# Patient Record
Sex: Female | Born: 1970 | Race: White | Hispanic: No | Marital: Married | Smoking: Never smoker
Health system: Southern US, Community
[De-identification: ages and names within clinical notes are randomized; demographics above are authoritative.]

## PROBLEM LIST (undated history)

## (undated) DIAGNOSIS — R51 Headache: Secondary | ICD-10-CM

## (undated) DIAGNOSIS — E063 Autoimmune thyroiditis: Secondary | ICD-10-CM

## (undated) DIAGNOSIS — R42 Dizziness and giddiness: Secondary | ICD-10-CM

## (undated) DIAGNOSIS — Z87442 Personal history of urinary calculi: Secondary | ICD-10-CM

## (undated) DIAGNOSIS — R5382 Chronic fatigue, unspecified: Secondary | ICD-10-CM

## (undated) DIAGNOSIS — R519 Headache, unspecified: Secondary | ICD-10-CM

## (undated) DIAGNOSIS — E785 Hyperlipidemia, unspecified: Secondary | ICD-10-CM

## (undated) HISTORY — DX: Headache, unspecified: R51.9

## (undated) HISTORY — DX: Autoimmune thyroiditis: E06.3

## (undated) HISTORY — DX: Personal history of urinary calculi: Z87.442

## (undated) HISTORY — DX: Dizziness and giddiness: R42

## (undated) HISTORY — DX: Chronic fatigue, unspecified: R53.82

## (undated) HISTORY — DX: Headache: R51

## (undated) HISTORY — PX: DILATION AND CURETTAGE OF UTERUS: SHX78

## (undated) HISTORY — DX: Hyperlipidemia, unspecified: E78.5

## (undated) HISTORY — PX: RHINOPLASTY: SUR1284

## (undated) HISTORY — PX: LITHOTRIPSY: SUR834

---
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
## 1999-10-25 ENCOUNTER — Other Ambulatory Visit: Admission: RE | Admit: 1999-10-25 | Discharge: 1999-10-25 | Payer: Self-pay | Admitting: Internal Medicine

## 1999-11-19 ENCOUNTER — Encounter (INDEPENDENT_AMBULATORY_CARE_PROVIDER_SITE_OTHER): Payer: Self-pay | Admitting: Specialist

## 1999-11-19 ENCOUNTER — Other Ambulatory Visit: Admission: RE | Admit: 1999-11-19 | Discharge: 1999-11-19 | Payer: Self-pay | Admitting: *Deleted

## 2000-04-05 ENCOUNTER — Other Ambulatory Visit: Admission: RE | Admit: 2000-04-05 | Discharge: 2000-04-05 | Payer: Self-pay | Admitting: *Deleted

## 2000-07-04 ENCOUNTER — Other Ambulatory Visit: Admission: RE | Admit: 2000-07-04 | Discharge: 2000-07-04 | Payer: Self-pay | Admitting: *Deleted

## 2001-01-10 ENCOUNTER — Other Ambulatory Visit: Admission: RE | Admit: 2001-01-10 | Discharge: 2001-01-10 | Payer: Self-pay | Admitting: *Deleted

## 2001-02-01 ENCOUNTER — Other Ambulatory Visit: Admission: RE | Admit: 2001-02-01 | Discharge: 2001-02-01 | Payer: Self-pay | Admitting: *Deleted

## 2001-02-01 ENCOUNTER — Encounter (INDEPENDENT_AMBULATORY_CARE_PROVIDER_SITE_OTHER): Payer: Self-pay

## 2001-04-02 ENCOUNTER — Other Ambulatory Visit: Admission: RE | Admit: 2001-04-02 | Discharge: 2001-04-02 | Payer: Self-pay | Admitting: Internal Medicine

## 2001-08-20 ENCOUNTER — Other Ambulatory Visit: Admission: RE | Admit: 2001-08-20 | Discharge: 2001-08-20 | Payer: Self-pay | Admitting: Obstetrics and Gynecology

## 2002-02-18 ENCOUNTER — Other Ambulatory Visit: Admission: RE | Admit: 2002-02-18 | Discharge: 2002-02-18 | Payer: Self-pay | Admitting: Obstetrics and Gynecology

## 2002-08-23 ENCOUNTER — Other Ambulatory Visit: Admission: RE | Admit: 2002-08-23 | Discharge: 2002-08-23 | Payer: Self-pay | Admitting: Obstetrics and Gynecology

## 2003-03-13 ENCOUNTER — Other Ambulatory Visit: Admission: RE | Admit: 2003-03-13 | Discharge: 2003-03-13 | Payer: Self-pay | Admitting: Internal Medicine

## 2004-06-04 ENCOUNTER — Other Ambulatory Visit: Admission: RE | Admit: 2004-06-04 | Discharge: 2004-06-04 | Payer: Self-pay | Admitting: Internal Medicine

## 2005-04-25 ENCOUNTER — Ambulatory Visit: Payer: Self-pay | Admitting: Internal Medicine

## 2005-05-06 ENCOUNTER — Ambulatory Visit: Payer: Self-pay | Admitting: Internal Medicine

## 2005-07-04 ENCOUNTER — Other Ambulatory Visit: Admission: RE | Admit: 2005-07-04 | Discharge: 2005-07-04 | Payer: Self-pay | Admitting: Internal Medicine

## 2005-07-04 ENCOUNTER — Ambulatory Visit: Payer: Self-pay | Admitting: Internal Medicine

## 2006-05-19 ENCOUNTER — Ambulatory Visit: Payer: Self-pay | Admitting: Internal Medicine

## 2006-06-01 ENCOUNTER — Encounter: Payer: Self-pay | Admitting: Internal Medicine

## 2006-06-01 ENCOUNTER — Ambulatory Visit: Payer: Self-pay | Admitting: Internal Medicine

## 2006-06-01 ENCOUNTER — Other Ambulatory Visit: Admission: RE | Admit: 2006-06-01 | Discharge: 2006-06-01 | Payer: Self-pay | Admitting: Internal Medicine

## 2008-09-08 ENCOUNTER — Ambulatory Visit: Payer: Self-pay | Admitting: Internal Medicine

## 2008-09-08 LAB — CONVERTED CEMR LAB
Albumin: 4.1 g/dL (ref 3.5–5.2)
Alkaline Phosphatase: 34 units/L — ABNORMAL LOW (ref 39–117)
BUN: 17 mg/dL (ref 6–23)
Basophils Absolute: 0.1 10*3/uL (ref 0.0–0.1)
Bilirubin Urine: NEGATIVE
Blood in Urine, dipstick: NEGATIVE
Cholesterol: 268 mg/dL (ref 0–200)
Direct LDL: 182.5 mg/dL
Eosinophils Absolute: 0.3 10*3/uL (ref 0.0–0.7)
Eosinophils Relative: 2.8 % (ref 0.0–5.0)
GFR calc Af Amer: 103 mL/min
GFR calc non Af Amer: 85 mL/min
Glucose, Urine, Semiquant: NEGATIVE
HCT: 39.5 % (ref 36.0–46.0)
HDL: 46.2 mg/dL (ref >39.0)
Ketones, urine, test strip: NEGATIVE
MCHC: 34.2 g/dL (ref 30.0–36.0)
MCV: 92.8 fL (ref 78.0–100.0)
Monocytes Absolute: 0.4 10*3/uL (ref 0.1–1.0)
Neutrophils Relative %: 58.3 % (ref 43.0–77.0)
Platelets: 375 10*3/uL (ref 150–400)
Potassium: 4.3 meq/L (ref 3.5–5.1)
RDW: 14.9 % — ABNORMAL HIGH (ref 11.5–14.6)
Specific Gravity, Urine: 1.025
Total Bilirubin: 0.8 mg/dL (ref 0.3–1.2)
Triglycerides: 151 mg/dL — ABNORMAL HIGH (ref 0–149)
WBC: 9.6 10*3/uL (ref 4.5–10.5)
pH: 5.5

## 2008-09-16 ENCOUNTER — Ambulatory Visit: Payer: Self-pay | Admitting: Internal Medicine

## 2008-09-16 DIAGNOSIS — E039 Hypothyroidism, unspecified: Secondary | ICD-10-CM

## 2008-09-16 DIAGNOSIS — J309 Allergic rhinitis, unspecified: Secondary | ICD-10-CM | POA: Insufficient documentation

## 2008-09-16 DIAGNOSIS — E038 Other specified hypothyroidism: Secondary | ICD-10-CM | POA: Insufficient documentation

## 2008-09-29 ENCOUNTER — Telehealth: Payer: Self-pay | Admitting: *Deleted

## 2008-10-31 ENCOUNTER — Ambulatory Visit: Payer: Self-pay | Admitting: Internal Medicine

## 2008-10-31 LAB — CONVERTED CEMR LAB
Cholesterol: 197 mg/dL (ref 0–200)
HDL: 43.8 mg/dL (ref >39.0)
LDL Cholesterol: 114 mg/dL — ABNORMAL HIGH (ref 0–99)
TSH: 0.17 microintl units/mL — ABNORMAL LOW (ref 0.35–5.50)
Triglycerides: 196 mg/dL — ABNORMAL HIGH (ref 0–149)

## 2008-11-11 ENCOUNTER — Ambulatory Visit: Payer: Self-pay | Admitting: Internal Medicine

## 2008-12-10 DIAGNOSIS — R635 Abnormal weight gain: Secondary | ICD-10-CM | POA: Insufficient documentation

## 2009-01-14 ENCOUNTER — Ambulatory Visit: Payer: Self-pay | Admitting: Internal Medicine

## 2009-01-14 LAB — CONVERTED CEMR LAB
Free T4: 1.8 ng/dL — ABNORMAL HIGH (ref 0.6–1.6)
TSH: 0.02 microintl units/mL — ABNORMAL LOW (ref 0.35–5.50)

## 2009-01-19 ENCOUNTER — Ambulatory Visit: Payer: Self-pay | Admitting: Internal Medicine

## 2009-01-19 DIAGNOSIS — L259 Unspecified contact dermatitis, unspecified cause: Secondary | ICD-10-CM | POA: Insufficient documentation

## 2009-03-04 ENCOUNTER — Telehealth: Payer: Self-pay | Admitting: Internal Medicine

## 2009-04-13 ENCOUNTER — Ambulatory Visit: Payer: Self-pay | Admitting: Internal Medicine

## 2009-04-13 LAB — CONVERTED CEMR LAB: TSH: 0.01 microintl units/mL — ABNORMAL LOW (ref 0.35–5.50)

## 2009-04-21 ENCOUNTER — Telehealth: Payer: Self-pay | Admitting: Internal Medicine

## 2009-10-27 ENCOUNTER — Ambulatory Visit: Payer: Self-pay | Admitting: Internal Medicine

## 2009-10-27 LAB — CONVERTED CEMR LAB
AST: 20 units/L (ref 0–37)
Albumin: 3.8 g/dL (ref 3.5–5.2)
Basophils Absolute: 0 10*3/uL (ref 0.0–0.1)
CO2: 29 meq/L (ref 19–32)
Chloride: 104 meq/L (ref 96–112)
GFR calc non Af Amer: 145.24 mL/min (ref >60)
Glucose, Bld: 81 mg/dL (ref 70–99)
HCT: 40.9 % (ref 36.0–46.0)
Hemoglobin: 13.9 g/dL (ref 12.0–15.0)
Lymphs Abs: 3.3 10*3/uL (ref 0.7–4.0)
MCHC: 34 g/dL (ref 30.0–36.0)
Monocytes Relative: 4 % (ref 3.0–12.0)
Neutro Abs: 5.4 10*3/uL (ref 1.4–7.7)
Nitrite: NEGATIVE
Potassium: 4.6 meq/L (ref 3.5–5.1)
RDW: 12.6 % (ref 11.5–14.6)
Sodium: 140 meq/L (ref 135–145)
Specific Gravity, Urine: 1.02
TSH: 0.05 microintl units/mL — ABNORMAL LOW (ref 0.35–5.50)
WBC Urine, dipstick: NEGATIVE

## 2009-11-02 ENCOUNTER — Telehealth: Payer: Self-pay | Admitting: Internal Medicine

## 2009-11-09 ENCOUNTER — Other Ambulatory Visit: Admission: RE | Admit: 2009-11-09 | Discharge: 2009-11-09 | Payer: Self-pay | Admitting: Internal Medicine

## 2009-11-09 ENCOUNTER — Ambulatory Visit: Payer: Self-pay | Admitting: Internal Medicine

## 2009-11-12 ENCOUNTER — Telehealth: Payer: Self-pay | Admitting: Internal Medicine

## 2010-11-15 ENCOUNTER — Ambulatory Visit: Payer: Self-pay | Admitting: Internal Medicine

## 2010-11-15 LAB — CONVERTED CEMR LAB
ALT: 16 units/L (ref 0–35)
Albumin: 4.2 g/dL (ref 3.5–5.2)
Alkaline Phosphatase: 52 units/L (ref 39–117)
Basophils Relative: 0.4 % (ref 0.0–3.0)
Bilirubin Urine: NEGATIVE
Blood, UA: NEGATIVE
CO2: 29 meq/L (ref 19–32)
Chloride: 104 meq/L (ref 96–112)
Direct LDL: 153.1 mg/dL
Eosinophils Absolute: 0.3 10*3/uL (ref 0.0–0.7)
Hemoglobin: 14 g/dL (ref 12.0–15.0)
MCHC: 35 g/dL (ref 30.0–36.0)
MCV: 88.1 fL (ref 78.0–100.0)
Monocytes Absolute: 0.4 10*3/uL (ref 0.1–1.0)
Neutro Abs: 5.8 10*3/uL (ref 1.4–7.7)
Potassium: 4.9 meq/L (ref 3.5–5.1)
RBC: 4.54 M/uL (ref 3.87–5.11)
Sodium: 141 meq/L (ref 135–145)
Total CHOL/HDL Ratio: 5
Total Protein: 7.2 g/dL (ref 6.0–8.3)
Urine Glucose: NEGATIVE mg/dL
Urobilinogen, UA: 0.2 (ref 0.0–1.0)

## 2010-11-22 ENCOUNTER — Other Ambulatory Visit
Admission: RE | Admit: 2010-11-22 | Discharge: 2010-11-22 | Payer: Self-pay | Source: Home / Self Care | Admitting: Internal Medicine

## 2010-11-22 ENCOUNTER — Ambulatory Visit: Payer: Self-pay | Admitting: Internal Medicine

## 2010-11-22 DIAGNOSIS — E785 Hyperlipidemia, unspecified: Secondary | ICD-10-CM

## 2010-11-22 DIAGNOSIS — M549 Dorsalgia, unspecified: Secondary | ICD-10-CM | POA: Insufficient documentation

## 2010-11-22 DIAGNOSIS — E782 Mixed hyperlipidemia: Secondary | ICD-10-CM | POA: Insufficient documentation

## 2010-11-22 LAB — CONVERTED CEMR LAB
Cholesterol, target level: 200 mg/dL
LDL Goal: 160 mg/dL

## 2010-12-29 ENCOUNTER — Telehealth: Payer: Self-pay | Admitting: Internal Medicine

## 2011-01-04 ENCOUNTER — Telehealth: Payer: Self-pay | Admitting: Internal Medicine

## 2011-01-06 NOTE — Assessment & Plan Note (Signed)
Summary: CPX // RS   Vital Signs:  Patient profile:   40 year old female Height:      67 inches Weight:      194 pounds BMI:     30.49 Temp:     98.1 degrees F oral Pulse rate:   72 / minute Resp:     14 per minute BP sitting:   120 / 70  (left arm)  Vitals Entered By: Willy Eddy, LPN (November 22, 2010 2:19 PM)  Nutrition Counseling: Patient's BMI is greater than 25 and therefore counseled on weight management options. CC: cpx and p ap, Lipid Management Is Patient Diabetic? No   Primary Care Provider:  Stacie Glaze MD  CC:  cpx and p ap and Lipid Management.  History of Present Illness: The pt was asked about all immunizations, health maint. services that are appropriate to their age and was given guidance on diet exercize  and weight management  The pt has persistant weight gain issues and complications from weight The weigth gain noted has efected lipids that were borderline The pt has chronic back pain and poor posture due to large breasts  Lipid Management History:      Negative NCEP/ATP III risk factors include female age less than 19 years old and non-tobacco-user status.     Preventive Screening-Counseling & Management  Alcohol-Tobacco     Smoking Status: never  Caffeine-Diet-Exercise     Does Patient Exercise: yes  Problems Prior to Update: 1)  Contact Dermatitis&other Eczema Due Unspec Cause  (ICD-692.9) 2)  Weight Gain  (ICD-783.1) 3)  Hypothyroidism  (ICD-244.9) 4)  Hypothyroidism, Primary  (ICD-244.9) 5)  Family History of Alcoholism/addiction  (ICD-V61.41) 6)  Family History Breast Cancer 1st Degree Relative <50  (ICD-V16.3) 7)  Allergic Rhinitis  (ICD-477.9) 8)  Physical Examination  (ICD-V70.0)  Medications Prior to Update: 1)  Nortrel 7/7/7 0.5/0.75/1-35 Mg-Mcg Tabs (Norethin-Eth Estrad Triphasic) .... Use As Directed 2)  Synthroid 88 Mcg Tabs (Levothyroxine Sodium) .Marland Kitchen.. 1 Once Daily 3)  Liothyronine Sodium 25 Mcg Tabs (Liothyronine  Sodium) .... One By Mouth Daily 4)  Betamethasone Dipropionate 0.05 % Crea (Betamethasone Dipropionate) .... Apply To Rash Daily 5)  Phentermine Hcl 37.5 Mg Caps (Phentermine Hcl) .Marland Kitchen.. 1 Once Daily 6)  Krill Oil 1000 Mg Caps (Krill Oil) .... Two By Mouth Two Times A Day  Current Medications (verified): 1)  Synthroid 88 Mcg Tabs (Levothyroxine Sodium) .Marland Kitchen.. 1 Once Daily 2)  Liothyronine Sodium 25 Mcg Tabs (Liothyronine Sodium) .... One By Mouth Daily 3)  Betamethasone Dipropionate 0.05 % Crea (Betamethasone Dipropionate) .... Apply To Rash Daily 4)  Crestor 20 Mg Tabs (Rosuvastatin Calcium) .... One By Mouth Weekly 5)  Phentermine Hcl 37.5 Mg Tabs (Phentermine Hcl) .... One By Mouth Daily  Allergies (verified): No Known Drug Allergies  Past History:  Family History: Last updated: 09/16/2008 Family History Breast cancer 1st degree relative <50 aunt Family History Hypertension Family History of Alcoholism/Addiction  father  Social History: Last updated: 09/16/2008 Married Never Smoked Regular exercise-yes  Risk Factors: Exercise: yes (11/22/2010)  Risk Factors: Smoking Status: never (11/22/2010)  Past medical, surgical, family and social histories (including risk factors) reviewed, and no changes noted (except as noted below).  Past Medical History: Reviewed history from 11/11/2008 and no changes required. Allergic rhinitis Hypothyroidism  Past Surgical History: Reviewed history from 09/16/2008 and no changes required. d and C GYN surgery hx of culposcopy and TCA treatments  Family History: Reviewed history from 09/16/2008  and no changes required. Family History Breast cancer 1st degree relative <50 aunt Family History Hypertension Family History of Alcoholism/Addiction  father  Social History: Reviewed history from 09/16/2008 and no changes required. Married Never Smoked Regular exercise-yes  Review of Systems  The patient denies anorexia, fever, weight  loss, weight gain, vision loss, decreased hearing, hoarseness, chest pain, syncope, dyspnea on exertion, peripheral edema, prolonged cough, headaches, hemoptysis, abdominal pain, melena, hematochezia, severe indigestion/heartburn, hematuria, incontinence, genital sores, muscle weakness, suspicious skin lesions, transient blindness, difficulty walking, depression, unusual weight change, abnormal bleeding, enlarged lymph nodes, angioedema, and breast masses.    Physical Exam  General:  alert, well-developed, and overweight-appearing.  weight loss of 6 lbs Head:  normocephalic and atraumatic.   Ears:  R ear normal and L ear normal.   Nose:  no external deformity and no external erythema.   Mouth:  good dentition and pharynx pink and moist.   Neck:  No deformities, masses, or tenderness noted. Lungs:  normal respiratory effort and no wheezes.   Heart:  normal rate and regular rhythm.   Abdomen:  soft, non-tender, and normal bowel sounds.   Pulses:  R and L carotid,radial,femoral,dorsalis pedis and posterior tibial pulses are full and equal bilaterally Extremities:  No clubbing, cyanosis, edema, or deformity noted with normal full range of motion of all joints.   Neurologic:  No cranial nerve deficits noted. Station and gait are normal. Plantar reflexes are down-going bilaterally. DTRs are symmetrical throughout. Sensory, motor and coordinative functions appear intact.   Impression & Recommendations:  Problem # 1:  WEIGHT GAIN (ICD-783.1) the phenterimine worked in the past  Problem # 2:  HYPOTHYROIDISM (ICD-244.9)  Her updated medication list for this problem includes:    Synthroid 88 Mcg Tabs (Levothyroxine sodium) .Marland Kitchen... 1 once daily    Liothyronine Sodium 25 Mcg Tabs (Liothyronine sodium) ..... One by mouth daily  Labs Reviewed: TSH: 0.06 (11/15/2010)    Chol: 228 (11/15/2010)   HDL: 42.40 (11/15/2010)   LDL: 114 (10/31/2008)   TG: 197.0 (11/15/2010)  Problem # 3:  HYPERLIPIDEMIA,  BORDERLINE (ICD-272.4)  Labs Reviewed: SGOT: 21 (11/15/2010)   SGPT: 16 (11/15/2010)  Lipid Goals: Chol Goal: 200 (11/22/2010)   HDL Goal: 40 (11/22/2010)   LDL Goal: 160 (11/22/2010)   TG Goal: 150 (11/22/2010)  10 Yr Risk Heart Disease: 3 %   HDL:42.40 (11/15/2010), 44.20 (10/27/2009)  LDL:114 (10/31/2008), DEL (09/08/2008)  Chol:228 (11/15/2010), 209 (10/27/2009)  Trig:197.0 (11/15/2010), 211.0 (10/27/2009)  Her updated medication list for this problem includes:    Crestor 20 Mg Tabs (Rosuvastatin calcium) ..... One by mouth weekly  Problem # 4:  BACK PAIN, LUMBOSACRAL, CHRONIC (ICD-724.5) due to large breast and posture  Problem # 5:  PHYSICAL EXAMINATION (ICD-V70.0) The pt was asked about all immunizations, health maint. services that are appropriate to their age and was given guidance on diet exercize  and weight management  Pap smear: NEGATIVE FOR INTRAEPITHELIAL LESIONS OR MALIGNANCY. (11/09/2009) Td Booster: Tdap (11/09/2009)   Flu Vax: Historical (10/07/2009)   Chol: 228 (11/15/2010)   HDL: 42.40 (11/15/2010)   LDL: 114 (10/31/2008)   TG: 197.0 (11/15/2010) TSH: 0.06 (11/15/2010)    Discussed using sunscreen, use of alcohol, drug use, self breast exam, routine dental care, routine eye care, schedule for GYN exam, routine physical exam, seat belts, multiple vitamins, osteoporosis prevention, adequate calcium intake in diet, recommendations for immunizations, mammograms and Pap smears.  Discussed exercise and checking cholesterol.  Discussed gun safety, safe sex, and  contraception.  Complete Medication List: 1)  Synthroid 88 Mcg Tabs (Levothyroxine sodium) .Marland Kitchen.. 1 once daily 2)  Liothyronine Sodium 25 Mcg Tabs (Liothyronine sodium) .... One by mouth daily 3)  Betamethasone Dipropionate 0.05 % Crea (Betamethasone dipropionate) .... Apply to rash daily 4)  Crestor 20 Mg Tabs (Rosuvastatin calcium) .... One by mouth weekly 5)  Phentermine Hcl 37.5 Mg Tabs (Phentermine hcl) .... One  by mouth daily  Lipid Assessment/Plan:      Based on NCEP/ATP III, the patient's risk factor category is "0-1 risk factors".  The patient's lipid goals are as follows: Total cholesterol goal is 200; LDL cholesterol goal is 160; HDL cholesterol goal is 40; Triglyceride goal is 150.    Patient Instructions: 1)  Please schedule a follow-up appointment in 3 months. 2)  Hepatic Panel prior to visit, ICD-9:995.20 3)  Lipid Panel prior to visit, ICD-9:272.4 Prescriptions: PHENTERMINE HCL 37.5 MG TABS (PHENTERMINE HCL) one by mouth daily  #30 x 2   Entered and Authorized by:   Stacie Glaze MD   Signed by:   Stacie Glaze MD on 11/22/2010   Method used:   Print then Give to Patient   RxID:   [id number]    Orders Added: 1)  Est. Patient 40-64 years [99396] 2)  Est. Patient Level III [14782]  Appended Document: Orders Update    Clinical Lists Changes  Orders: Added new Service order of Admin 1st Vaccine (95621) - Signed Added new Service order of Flu Vaccine 47yrs + [phone]) - Signed Observations: Added new observation of ROS: Flu Vaccine Consent Questions     Do you have a history of severe allergic reactions to this vaccine? no    Any prior history of allergic reactions to egg and/or gelatin? no    Do you have a sensitivity to the preservative Thimersol? no    Do you have a past history of Guillan-Barre Syndrome? no    Do you currently have an acute febrile illness? no    Have you ever had a severe reaction to latex? no    Vaccine information given and explained to patient? yes    Are you currently pregnant? no    Lot Number:[id number]   Exp Date:06/04/2011   Site Given  Left Deltoid IM  (11/22/2010 14:58) Added new observation of FLU VAX VIS: 06/29/10 version (11/22/2010 14:58) Added new observation of FLU VAXLOT: [id number] (11/22/2010 14:58) Added new observation of FLU VAXMFR: [employer] (11/22/2010 14:58) Added new observation of FLU VAX EXP: 06/04/2011  (11/22/2010 14:58) Added new observation of FLU VAX DSE: 0.7ml (11/22/2010 14:58) Added new observation of FLU VAX: Fluvax 3+ (11/22/2010 14:58)        Review of Systems       Flu Vaccine Consent Questions     Do you have a history of severe allergic reactions to this vaccine? no    Any prior history of allergic reactions to egg and/or gelatin? no    Do you have a sensitivity to the preservative Thimersol? no    Do you have a past history of Guillan-Barre Syndrome? no    Do you currently have an acute febrile illness? no    Have you ever had a severe reaction to latex? no    Vaccine information given and explained to patient? yes    Are you currently pregnant? no    Lot Number:[id number]   Exp Date:06/04/2011   Site Given  Left Deltoid IM

## 2011-01-06 NOTE — Progress Notes (Signed)
Summary: order fax to [facility]  Phone Note Call from Patient Call back at Home Phone [phone]   Caller: Patient Call For: Stacie Glaze MD Summary of Call: Pt stated she needs order fax to [facility] for screening mammogram fax to [phone] Initial call taken by: Heron Sabins,  December 29, 2010 3:01 PM

## 2011-01-12 NOTE — Progress Notes (Signed)
Summary: Pt says [facility] has not rcvd order from mmg  Phone Note Call from Patient Call back at [facility] Phone [phone]   Caller: Patient Summary of Call: Pt called and said that [facility] has not rcvd order from Dr Lovell Sheehan, for pt to get mmg. Pls send order to [facility]       fax # [phone] [address].   Pls let pt know when this has been sent.    Initial call taken by: Lucy Antigua,  January 04, 2011 3:18 PM  Follow-up for Phone Call        repeasted fax and pt informed that this fax number is to the out patient desk Follow-up by: Willy Eddy, LPN,  January 04, 2011 4:05 PM

## 2011-01-17 ENCOUNTER — Telehealth: Payer: Self-pay | Admitting: *Deleted

## 2011-01-17 DIAGNOSIS — N6320 Unspecified lump in the left breast, unspecified quadrant: Secondary | ICD-10-CM

## 2011-01-17 NOTE — Telephone Encounter (Signed)
Pt had mammogram at  [facility].                       In [place], was told their was an abnormality.  Imaging were supposed to be sent here, and she was to follow up this week.  Please call.

## 2011-01-17 NOTE — Telephone Encounter (Signed)
Pt is going to attempt to get her results ASAP.  Will schedule as soon as we get the results.

## 2011-01-17 NOTE — Telephone Encounter (Signed)
One obtain a faxed copy of the mammography and schedule her for office visit this week once we've obtained

## 2011-01-19 NOTE — Telephone Encounter (Signed)
Please call for results

## 2011-01-20 ENCOUNTER — Other Ambulatory Visit: Payer: Self-pay | Admitting: Internal Medicine

## 2011-01-20 DIAGNOSIS — N6320 Unspecified lump in the left breast, unspecified quadrant: Secondary | ICD-10-CM

## 2011-01-26 ENCOUNTER — Ambulatory Visit
Admission: RE | Admit: 2011-01-26 | Discharge: 2011-01-26 | Disposition: A | Discharge status: Home / Self Care | Payer: BC Managed Care – PPO | Source: Ambulatory Visit | Attending: Internal Medicine | Admitting: Internal Medicine

## 2011-01-26 ENCOUNTER — Other Ambulatory Visit: Payer: Self-pay | Admitting: *Deleted

## 2011-01-26 DIAGNOSIS — N6320 Unspecified lump in the left breast, unspecified quadrant: Secondary | ICD-10-CM

## 2011-01-26 DIAGNOSIS — I898 Other specified noninfective disorders of lymphatic vessels and lymph nodes: Secondary | ICD-10-CM

## 2011-01-26 MED ORDER — CEPHALEXIN 500 MG PO TABS: 500.0000 mg | ORAL_TABLET | Freq: Three times a day (TID) | ORAL | Status: AC

## 2011-02-21 ENCOUNTER — Other Ambulatory Visit (INDEPENDENT_AMBULATORY_CARE_PROVIDER_SITE_OTHER): Payer: BC Managed Care – PPO | Admitting: Internal Medicine

## 2011-02-21 DIAGNOSIS — T887XXA Unspecified adverse effect of drug or medicament, initial encounter: Secondary | ICD-10-CM

## 2011-02-21 DIAGNOSIS — E785 Hyperlipidemia, unspecified: Secondary | ICD-10-CM

## 2011-02-21 LAB — HEPATIC FUNCTION PANEL
ALT: 18 U/L (ref 0–35)
Albumin: 4.2 g/dL (ref 3.5–5.2)
Bilirubin, Direct: 0.1 mg/dL (ref 0.0–0.3)
Total Protein: 6.9 g/dL (ref 6.0–8.3)

## 2011-02-21 LAB — LIPID PANEL
Cholesterol: 160 mg/dL (ref 0–200)
Triglycerides: 118 mg/dL (ref 0.0–149.0)
VLDL: 23.6 mg/dL (ref 0.0–40.0)

## 2011-02-25 ENCOUNTER — Encounter: Payer: Self-pay | Admitting: Internal Medicine

## 2011-02-28 ENCOUNTER — Ambulatory Visit (INDEPENDENT_AMBULATORY_CARE_PROVIDER_SITE_OTHER): Payer: BC Managed Care – PPO | Admitting: Internal Medicine

## 2011-02-28 ENCOUNTER — Encounter: Payer: Self-pay | Admitting: Internal Medicine

## 2011-02-28 VITALS — BP 110/70 | HR 72 | Temp 98.2°F | Resp 14 | Ht 67.0 in | Wt 191.0 lb

## 2011-02-28 DIAGNOSIS — R635 Abnormal weight gain: Secondary | ICD-10-CM

## 2011-02-28 DIAGNOSIS — E785 Hyperlipidemia, unspecified: Secondary | ICD-10-CM

## 2011-02-28 DIAGNOSIS — J309 Allergic rhinitis, unspecified: Secondary | ICD-10-CM

## 2011-02-28 MED ORDER — CETIRIZINE HCL 10 MG PO CHEW: 10.0000 mg | CHEWABLE_TABLET | Freq: Every day | ORAL | Status: DC

## 2011-02-28 MED ORDER — MOMETASONE FUROATE 50 MCG/ACT NA SUSP: 2.0000 | Freq: Every day | NASAL | Status: DC

## 2011-02-28 MED ORDER — PHENTERMINE HCL 37.5 MG PO CAPS: 37.5000 mg | ORAL_CAPSULE | ORAL | Status: DC

## 2011-02-28 NOTE — Assessment & Plan Note (Signed)
Severe allergic rhinitis due to on flair recommend Nasonex 2 sprays in nostril in the morning and Zyrtec 10 mg by mouth at bedtime prescription sent to pharmacy of her choice

## 2011-02-28 NOTE — Assessment & Plan Note (Signed)
Excellent response to pulse Crestor 20 mg once a week she is at goal for her age

## 2011-02-28 NOTE — Progress Notes (Signed)
  Subjective:    Patient ID: Theresa Pacheco, female    DOB: 1971-09-29, 40 y.o.   MRN: [id number]  HPI  Theresa Pacheco is a 40 year old white female who struggles with obesity degenerative joint disease and risk for diabetes and metabolic syndrome she presents today for followup of her weight management and for cholesterol recheck she was placed on Crestor 20 mg once weekly and burst therapy and has responded with excellent control   her weight is still a struggle in progress we will refill the phenterimine and recommended that she have a nutritional  consultation   Review of Systems  Constitutional: Negative for activity change, appetite change and fatigue.  HENT: Positive for congestion, rhinorrhea and postnasal drip. Negative for ear pain, neck pain and sinus pressure.   Eyes: Positive for discharge and itching. Negative for redness and visual disturbance.  Respiratory: Negative for cough, shortness of breath and wheezing.   Gastrointestinal: Negative for abdominal pain and abdominal distention.  Genitourinary: Negative for dysuria, frequency and menstrual problem.  Musculoskeletal: Negative for myalgias, joint swelling and arthralgias.  Skin: Negative for rash and wound.  Neurological: Negative for dizziness, weakness and headaches.  Hematological: Negative for adenopathy. Does not bruise/bleed easily.  Psychiatric/Behavioral: Negative for sleep disturbance and decreased concentration.   Past Medical History  Diagnosis Date  . Allergic rhinitis   . Hypothyroidism    Past Surgical History  Procedure Date  . Dilation and curettage of uterus   . Gyn surgery   . Hx of culposcopy and tca     reports that she has never smoked. She does not have any smokeless tobacco history on file. She reports that she does not drink alcohol or use illicit drugs. family history includes Alcohol abuse in her father and Cancer (age of onset:50) in her maternal aunt. No Known Allergies     Objective:     Physical Exam  Constitutional: She is oriented to person, place, and time. She appears well-developed and well-nourished. No distress.  HENT:  Head: Normocephalic and atraumatic.  Right Ear: External ear normal.  Left Ear: External ear normal.  Nose: Nose normal.  Mouth/Throat: Oropharynx is clear and moist.  Eyes: Conjunctivae and EOM are normal. Pupils are equal, round, and reactive to light.  Neck: Normal range of motion. Neck supple. No JVD present. No tracheal deviation present. No thyromegaly present.  Cardiovascular: Normal rate, regular rhythm, normal heart sounds and intact distal pulses.   No murmur heard. Pulmonary/Chest: Effort normal and breath sounds normal. She has no wheezes. She exhibits no tenderness.  Abdominal: Soft. Bowel sounds are normal.  Musculoskeletal: Normal range of motion. She exhibits no edema and no tenderness.  Lymphadenopathy:    She has no cervical adenopathy.  Neurological: She is alert and oriented to person, place, and time. She has normal reflexes. No cranial nerve deficit.  Skin: Skin is warm and dry. She is not diaphoretic.  Psychiatric: She has a normal mood and affect. Her behavior is normal.          Assessment & Plan:

## 2011-02-28 NOTE — Assessment & Plan Note (Signed)
Refill for phenterimine and to help her with her weight her weight affects both her blood pressure her degenerative joint disease and her risk for diabetes which are high

## 2011-05-13 ENCOUNTER — Encounter: Payer: Self-pay | Admitting: Internal Medicine

## 2011-06-20 ENCOUNTER — Other Ambulatory Visit: Payer: Self-pay | Admitting: Internal Medicine

## 2011-06-20 DIAGNOSIS — N63 Unspecified lump in unspecified breast: Secondary | ICD-10-CM

## 2011-07-19 ENCOUNTER — Other Ambulatory Visit: Payer: Self-pay | Admitting: Internal Medicine

## 2011-07-29 ENCOUNTER — Ambulatory Visit
Admission: RE | Admit: 2011-07-29 | Discharge: 2011-07-29 | Disposition: A | Discharge status: Home / Self Care | Payer: BC Managed Care – PPO | Source: Ambulatory Visit | Attending: Internal Medicine | Admitting: Internal Medicine

## 2011-07-29 DIAGNOSIS — N63 Unspecified lump in unspecified breast: Secondary | ICD-10-CM

## 2012-01-10 ENCOUNTER — Other Ambulatory Visit: Payer: Self-pay | Admitting: *Deleted

## 2012-01-10 MED ORDER — PHENTERMINE HCL 37.5 MG PO CAPS: 37.5000 mg | ORAL_CAPSULE | ORAL | Status: DC

## 2012-01-31 ENCOUNTER — Other Ambulatory Visit: Payer: Self-pay | Admitting: Internal Medicine

## 2012-01-31 DIAGNOSIS — N63 Unspecified lump in unspecified breast: Secondary | ICD-10-CM

## 2012-02-06 ENCOUNTER — Encounter: Payer: Self-pay | Admitting: Internal Medicine

## 2012-02-06 ENCOUNTER — Other Ambulatory Visit (HOSPITAL_COMMUNITY)
Admission: RE | Admit: 2012-02-06 | Discharge: 2012-02-06 | Disposition: A | Discharge status: Home / Self Care | Payer: BC Managed Care – PPO | Source: Ambulatory Visit | Attending: Internal Medicine | Admitting: Internal Medicine

## 2012-02-06 ENCOUNTER — Ambulatory Visit (INDEPENDENT_AMBULATORY_CARE_PROVIDER_SITE_OTHER): Payer: BC Managed Care – PPO | Admitting: Internal Medicine

## 2012-02-06 VITALS — BP 122/80 | HR 76 | Temp 98.2°F | Resp 16 | Ht 66.0 in | Wt 198.0 lb

## 2012-02-06 DIAGNOSIS — E669 Obesity, unspecified: Secondary | ICD-10-CM

## 2012-02-06 DIAGNOSIS — Z0000 Encounter for general adult medical examination without abnormal findings: Secondary | ICD-10-CM

## 2012-02-06 DIAGNOSIS — Z01419 Encounter for gynecological examination (general) (routine) without abnormal findings: Secondary | ICD-10-CM | POA: Insufficient documentation

## 2012-02-06 LAB — POCT URINALYSIS DIPSTICK
Bilirubin, UA: NEGATIVE
Leukocytes, UA: NEGATIVE
Protein, UA: NEGATIVE
Spec Grav, UA: 1.02

## 2012-02-06 LAB — CBC WITH DIFFERENTIAL/PLATELET
Basophils Relative: 0.5 % (ref 0.0–3.0)
Eosinophils Absolute: 0.2 10*3/uL (ref 0.0–0.7)
Eosinophils Relative: 1.6 % (ref 0.0–5.0)
Hemoglobin: 14 g/dL (ref 12.0–15.0)
Lymphocytes Relative: 28.1 % (ref 12.0–46.0)
MCHC: 33.6 g/dL (ref 30.0–36.0)
MCV: 91.1 fl (ref 78.0–100.0)
Monocytes Absolute: 0.5 10*3/uL (ref 0.1–1.0)
Neutro Abs: 7.6 10*3/uL (ref 1.4–7.7)
RBC: 4.57 Mil/uL (ref 3.87–5.11)
WBC: 11.6 10*3/uL — ABNORMAL HIGH (ref 4.5–10.5)

## 2012-02-06 LAB — BASIC METABOLIC PANEL
CO2: 30 mEq/L (ref 19–32)
Chloride: 101 mEq/L (ref 96–112)
Glucose, Bld: 70 mg/dL (ref 70–99)
Potassium: 3.4 mEq/L — ABNORMAL LOW (ref 3.5–5.1)
Sodium: 138 mEq/L (ref 135–145)

## 2012-02-06 LAB — HEPATIC FUNCTION PANEL
AST: 18 U/L (ref 0–37)
Albumin: 4.3 g/dL (ref 3.5–5.2)
Alkaline Phosphatase: 54 U/L (ref 39–117)
Total Protein: 7.7 g/dL (ref 6.0–8.3)

## 2012-02-06 MED ORDER — PHENTERMINE HCL 37.5 MG PO CAPS: 37.5000 mg | ORAL_CAPSULE | ORAL | Status: DC

## 2012-02-06 NOTE — Progress Notes (Signed)
Addended by: Willy Eddy on: 02/06/2012 06:30 PM   Modules accepted: Orders, SmartSet

## 2012-02-06 NOTE — Progress Notes (Signed)
Subjective:    Patient ID: Theresa Pacheco, female    DOB: June 10, 1971, 41 y.o.   MRN: [id number]  HPI   Patient presents for complete physical examination a breast examination and a Pap smear.  Initially her blood pressure was elevated but after recheck it was found to be 120/80 in both arms for believe that her initial elevation of blood pressure was due to the fact that she has a cold and has been taking some cough and cold medications.  The patient has gained weight she has not gained an additional 8 pounds moving her into the obese range. She also has large pendulous breasts and has upper thoracic back pain that has been a constant recurring complaints.   Review of Systems  Constitutional: Negative for activity change, appetite change and fatigue.  HENT: Negative for ear pain, congestion, neck pain, postnasal drip and sinus pressure.   Eyes: Negative for redness and visual disturbance.  Respiratory: Negative for cough, shortness of breath and wheezing.   Gastrointestinal: Negative for abdominal pain and abdominal distention.  Genitourinary: Negative for dysuria, frequency and menstrual problem.  Musculoskeletal: Negative for myalgias, joint swelling and arthralgias.  Skin: Negative for rash and wound.  Neurological: Negative for dizziness, weakness and headaches.  Hematological: Negative for adenopathy. Does not bruise/bleed easily.  Psychiatric/Behavioral: Negative for sleep disturbance and decreased concentration.   Past Medical History  Diagnosis Date  . Allergic rhinitis   . Hypothyroidism     History   Social History  . Marital Status: Married    Spouse Name: N/A    Number of Children: N/A  . Years of Education: N/A   Occupational History  . Not on file.   Social History Main Topics  . Smoking status: Never Smoker   . Smokeless tobacco: Not on file  . Alcohol Use: No  . Drug Use: No  . Sexually Active: Yes   Other Topics Concern  . Not on file    Social History Narrative  . No narrative on file    Past Surgical History  Procedure Date  . Dilation and curettage of uterus   . Gyn surgery   . Hx of culposcopy and tca     Family History  Problem Relation Age of Onset  . Alcohol abuse Father   . Cancer Maternal Aunt 50    breast    No Known Allergies  Current Outpatient Prescriptions on File Prior to Visit  Medication Sig Dispense Refill  . phentermine 37.5 MG capsule Take 1 capsule (37.5 mg total) by mouth every morning.  30 capsule  3  . rosuvastatin (CRESTOR) 20 MG tablet Take 20 mg by mouth once a week.        Marland Kitchen SYNTHROID 88 MCG tablet TAKE 1 TABLET DAILY  30 tablet  7  . liothyronine (CYTOMEL) 25 MCG tablet Take 25 mcg by mouth daily.          BP 122/80  Pulse 76  Temp 98.2 F (36.8 C)  Resp 16  Ht 5\' 6"  (1.676 m)  Wt 198 lb (89.812 kg)  BMI 31.96 kg/m2       Objective:   Physical Exam  Nursing note and vitals reviewed. Constitutional: She is oriented to person, place, and time. She appears well-developed and well-nourished. No distress.  HENT:  Head: Normocephalic and atraumatic.  Right Ear: External ear normal.  Left Ear: External ear normal.  Nose: Nose normal.  Mouth/Throat: Oropharynx is clear and moist.  Eyes: Conjunctivae  and EOM are normal. Pupils are equal, round, and reactive to light.  Neck: Normal range of motion. Neck supple. No JVD present. No tracheal deviation present. No thyromegaly present.  Cardiovascular: Normal rate, regular rhythm, normal heart sounds and intact distal pulses.   No murmur heard. Pulmonary/Chest: Effort normal and breath sounds normal. She has no wheezes. She exhibits no tenderness.  Abdominal: Soft. Bowel sounds are normal.  Musculoskeletal: Normal range of motion. She exhibits no edema and no tenderness.  Lymphadenopathy:    She has no cervical adenopathy.  Neurological: She is alert and oriented to person, place, and time. She has normal reflexes. No  cranial nerve deficit.  Skin: Skin is warm and dry. She is not diaphoretic.  Psychiatric: She has a normal mood and affect. Her behavior is normal.          Assessment & Plan:   This is a routine physical examination for this healthy  Female. Reviewed all health maintenance protocols including mammography colonoscopy bone density and reviewed appropriate screening labs. Her immunization history was reviewed as well as her current medications and allergies refills of her chronic medications were given and the plan for yearly health maintenance was discussed all orders and referrals were made as appropriate.  The patient has persistent weight gain we discussed diet exercise and appetite suppressant.  We'll also recommended that she take to review the DASH diet

## 2012-02-07 LAB — VITAMIN D 25 HYDROXY (VIT D DEFICIENCY, FRACTURES): Vit D, 25-Hydroxy: 32 ng/mL (ref 30–89)

## 2012-02-10 ENCOUNTER — Other Ambulatory Visit: Payer: Self-pay | Admitting: *Deleted

## 2012-02-10 NOTE — Progress Notes (Signed)
Pt informed

## 2012-02-10 NOTE — Telephone Encounter (Signed)
Pt informed

## 2012-02-14 ENCOUNTER — Ambulatory Visit
Admission: RE | Admit: 2012-02-14 | Discharge: 2012-02-14 | Disposition: A | Discharge status: Home / Self Care | Payer: BC Managed Care – PPO | Source: Ambulatory Visit | Attending: Internal Medicine | Admitting: Internal Medicine

## 2012-02-14 DIAGNOSIS — N63 Unspecified lump in unspecified breast: Secondary | ICD-10-CM

## 2012-06-15 ENCOUNTER — Other Ambulatory Visit: Payer: Self-pay | Admitting: *Deleted

## 2012-06-15 MED ORDER — LEVOTHYROXINE SODIUM 88 MCG PO TABS: 88.0000 ug | ORAL_TABLET | Freq: Every day | ORAL | Status: DC

## 2013-02-04 ENCOUNTER — Other Ambulatory Visit: Payer: Self-pay

## 2013-02-04 DIAGNOSIS — Z1231 Encounter for screening mammogram for malignant neoplasm of breast: Secondary | ICD-10-CM

## 2013-02-25 ENCOUNTER — Ambulatory Visit
Admission: RE | Admit: 2013-02-25 | Discharge: 2013-02-25 | Disposition: A | Discharge status: Home / Self Care | Payer: BC Managed Care – PPO | Source: Ambulatory Visit

## 2013-02-25 DIAGNOSIS — Z1231 Encounter for screening mammogram for malignant neoplasm of breast: Secondary | ICD-10-CM

## 2013-02-27 ENCOUNTER — Other Ambulatory Visit: Payer: Self-pay | Admitting: Internal Medicine

## 2013-02-27 DIAGNOSIS — R928 Other abnormal and inconclusive findings on diagnostic imaging of breast: Secondary | ICD-10-CM

## 2013-03-01 ENCOUNTER — Ambulatory Visit
Admission: RE | Admit: 2013-03-01 | Discharge: 2013-03-01 | Disposition: A | Discharge status: Home / Self Care | Payer: BC Managed Care – PPO | Source: Ambulatory Visit | Attending: Internal Medicine | Admitting: Internal Medicine

## 2013-03-01 DIAGNOSIS — R928 Other abnormal and inconclusive findings on diagnostic imaging of breast: Secondary | ICD-10-CM

## 2013-03-04 ENCOUNTER — Other Ambulatory Visit: Payer: BC Managed Care – PPO

## 2013-06-16 ENCOUNTER — Other Ambulatory Visit: Payer: Self-pay | Admitting: Internal Medicine

## 2013-10-25 ENCOUNTER — Other Ambulatory Visit: Payer: Self-pay | Admitting: Family Medicine

## 2013-10-25 DIAGNOSIS — N63 Unspecified lump in unspecified breast: Secondary | ICD-10-CM

## 2013-11-04 ENCOUNTER — Ambulatory Visit
Admission: RE | Admit: 2013-11-04 | Discharge: 2013-11-04 | Disposition: A | Discharge status: Home / Self Care | Payer: BC Managed Care – PPO | Source: Ambulatory Visit | Attending: Family Medicine | Admitting: Family Medicine

## 2013-11-04 DIAGNOSIS — N63 Unspecified lump in unspecified breast: Secondary | ICD-10-CM

## 2014-01-31 ENCOUNTER — Other Ambulatory Visit: Payer: Self-pay | Admitting: Family Medicine

## 2014-01-31 DIAGNOSIS — N644 Mastodynia: Secondary | ICD-10-CM

## 2014-02-10 ENCOUNTER — Encounter: Payer: Self-pay | Admitting: Family Medicine

## 2014-02-10 ENCOUNTER — Ambulatory Visit (INDEPENDENT_AMBULATORY_CARE_PROVIDER_SITE_OTHER): Payer: BC Managed Care – PPO | Admitting: Family Medicine

## 2014-02-10 VITALS — BP 120/84 | Temp 98.7°F | Wt 200.0 lb

## 2014-02-10 DIAGNOSIS — J309 Allergic rhinitis, unspecified: Secondary | ICD-10-CM

## 2014-02-10 MED ORDER — FLUTICASONE PROPIONATE 50 MCG/ACT NA SUSP: 2.0000 | Freq: Every day | NASAL | Status: DC

## 2014-02-10 NOTE — Patient Instructions (Signed)
-  flonase 2 sprays each nostril for 1 month then 1 spray each nostril  -claritin or zyrtec daily  -follow up with allergist if persists

## 2014-02-10 NOTE — Progress Notes (Signed)
Pre visit review using our clinic review tool, if applicable. No additional management support is needed unless otherwise documented below in the visit note. 

## 2014-02-10 NOTE — Progress Notes (Signed)
Chief Complaint  Patient presents with  . Headache    HPI:  Theresa Pacheco, a 43 yo F pt of Dr. 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  Past Medical History  Diagnosis Date  . Allergic rhinitis   . Hypothyroidism     Past Surgical History  Procedure Laterality Date  . Dilation and curettage of uterus    . Gyn surgery    . Hx of culposcopy and tca      Family History  Problem Relation Age of Onset  . Alcohol abuse Father   . Cancer Maternal Aunt 50    breast    History   Social History  . Marital Status: Married    Spouse Name: N/A    Number of Children: N/A  . Years of Education: N/A   Social History Main Topics  . Smoking status: Never Smoker   . Smokeless tobacco: None  . Alcohol Use: No  . Drug Use: No  . Sexual Activity: Yes   Other Topics Concern  . None   Social History Narrative  . None    Current outpatient prescriptions:rosuvastatin (CRESTOR) 20 MG tablet, Take 20 mg by mouth once a week.  , Disp: , Rfl: ;  SYNTHROID 88 MCG tablet, TAKE 1 TABLET (88 MCG TOTAL) BY MOUTH DAILY., Disp: 90 tablet, Rfl: 3;  fluticasone (FLONASE) 50 MCG/ACT nasal spray, Place 2 sprays into both nostrils daily., Disp: 16 g, Rfl: 1;  liothyronine (CYTOMEL) 25 MCG tablet, Take 25 mcg by mouth daily.  , Disp: , Rfl:  phentermine 37.5 MG capsule, Take 1 capsule (37.5 mg total) by mouth every morning., Disp: 30 capsule, Rfl: 3  EXAM:  Filed Vitals:   02/10/14 1511  BP: 120/84  Temp: 98.7 F (37.1 C)    Body mass index is 32.3 kg/(m^2).  GENERAL: vitals reviewed and listed above, alert, oriented,  appears well hydrated and in no acute distress  HEENT: atraumatic, conjunttiva clear, no obvious abnormalities on inspection of external nose and ears, normal appearance of ear canals and TMs, clear nasal congestion, mild post oropharyngeal erythema with PND, no tonsillar edema or exudate, no sinus TTP  NECK: no obvious masses on inspection  LUNGS: clear to auscultation bilaterally, no wheezes, rales or rhonchi, good air movement  CV: HRRR, no peripheral edema  MS: moves all extremities without noticeable abnormality  PSYCH: pleasant and cooperative, no obvious depression or anxiety  ASSESSMENT AND PLAN:  Discussed the following assessment and plan:  ALLERGIC RHINITIS - Plan: fluticasone (FLONASE) 50 MCG/ACT nasal spray  -tx with INS and daily antihistamine -number given for allergist if wishes to do allergy testing and advised to bring report from building in term of what mold she was exposed to -warned of chronic daily analgesic related type headaches -follow up with PCP or allergist in 1 month -Patient advised to return or notify a doctor immediately if symptoms worsen or persist or new concerns arise.  Patient Instructions  -flonase 2 sprays each nostril for 1 month then 1 spray each nostril  -claritin or zyrtec daily  -follow up with allergist if persists     KIM, HANNAH R.

## 2014-04-08 ENCOUNTER — Other Ambulatory Visit: Payer: Self-pay | Admitting: Family Medicine

## 2014-04-08 DIAGNOSIS — N6001 Solitary cyst of right breast: Secondary | ICD-10-CM

## 2014-04-21 ENCOUNTER — Ambulatory Visit
Admission: RE | Admit: 2014-04-21 | Discharge: 2014-04-21 | Disposition: A | Discharge status: Home / Self Care | Payer: BC Managed Care – PPO | Source: Ambulatory Visit | Attending: Family Medicine | Admitting: Family Medicine

## 2014-04-21 ENCOUNTER — Encounter (INDEPENDENT_AMBULATORY_CARE_PROVIDER_SITE_OTHER): Payer: Self-pay

## 2014-04-21 DIAGNOSIS — N6001 Solitary cyst of right breast: Secondary | ICD-10-CM

## 2015-03-26 ENCOUNTER — Other Ambulatory Visit: Payer: Self-pay

## 2015-03-26 DIAGNOSIS — Z1231 Encounter for screening mammogram for malignant neoplasm of breast: Secondary | ICD-10-CM

## 2015-04-24 ENCOUNTER — Ambulatory Visit
Admission: RE | Admit: 2015-04-24 | Discharge: 2015-04-24 | Disposition: A | Discharge status: Home / Self Care | Payer: BLUE CROSS/BLUE SHIELD | Source: Ambulatory Visit

## 2015-04-24 DIAGNOSIS — Z1231 Encounter for screening mammogram for malignant neoplasm of breast: Secondary | ICD-10-CM

## 2016-06-27 ENCOUNTER — Other Ambulatory Visit: Payer: Self-pay | Admitting: Family Medicine

## 2016-06-27 ENCOUNTER — Ambulatory Visit
Admission: RE | Admit: 2016-06-27 | Discharge: 2016-06-27 | Disposition: A | Discharge status: Home / Self Care | Payer: BC Managed Care – PPO | Source: Ambulatory Visit | Attending: Family Medicine | Admitting: Family Medicine

## 2016-06-27 DIAGNOSIS — Z1231 Encounter for screening mammogram for malignant neoplasm of breast: Secondary | ICD-10-CM

## 2017-05-30 ENCOUNTER — Other Ambulatory Visit: Payer: Self-pay | Admitting: Internal Medicine

## 2017-05-30 ENCOUNTER — Other Ambulatory Visit: Payer: Self-pay

## 2017-05-30 DIAGNOSIS — Z1231 Encounter for screening mammogram for malignant neoplasm of breast: Secondary | ICD-10-CM

## 2017-07-04 ENCOUNTER — Ambulatory Visit
Admission: RE | Admit: 2017-07-04 | Discharge: 2017-07-04 | Disposition: A | Discharge status: Home / Self Care | Payer: BC Managed Care – PPO | Source: Ambulatory Visit

## 2017-07-04 ENCOUNTER — Other Ambulatory Visit: Payer: Self-pay | Admitting: Physician Assistant

## 2017-07-04 DIAGNOSIS — Z1231 Encounter for screening mammogram for malignant neoplasm of breast: Secondary | ICD-10-CM

## 2018-07-24 ENCOUNTER — Other Ambulatory Visit: Payer: Self-pay | Admitting: Physician Assistant

## 2018-07-24 DIAGNOSIS — Z1231 Encounter for screening mammogram for malignant neoplasm of breast: Secondary | ICD-10-CM

## 2018-08-17 ENCOUNTER — Ambulatory Visit
Admission: RE | Admit: 2018-08-17 | Discharge: 2018-08-17 | Disposition: A | Discharge status: Home / Self Care | Payer: BC Managed Care – PPO | Source: Ambulatory Visit | Attending: Physician Assistant | Admitting: Physician Assistant

## 2018-08-17 DIAGNOSIS — Z1231 Encounter for screening mammogram for malignant neoplasm of breast: Secondary | ICD-10-CM

## 2018-10-23 ENCOUNTER — Ambulatory Visit: Payer: BC Managed Care – PPO | Admitting: Neurology

## 2018-10-23 ENCOUNTER — Encounter: Payer: Self-pay | Admitting: Neurology

## 2018-10-23 ENCOUNTER — Encounter: Payer: Self-pay | Admitting: *Deleted

## 2018-10-23 VITALS — BP 130/88 | HR 80 | Ht 64.25 in | Wt 203.0 lb

## 2018-10-23 DIAGNOSIS — G43709 Chronic migraine without aura, not intractable, without status migrainosus: Secondary | ICD-10-CM | POA: Diagnosis not present

## 2018-10-23 DIAGNOSIS — H8110 Benign paroxysmal vertigo, unspecified ear: Secondary | ICD-10-CM | POA: Insufficient documentation

## 2018-10-23 DIAGNOSIS — IMO0002 Reserved for concepts with insufficient information to code with codable children: Secondary | ICD-10-CM | POA: Insufficient documentation

## 2018-10-23 NOTE — Patient Instructions (Addendum)
Magnesium oxide 400 mg twice a day  Riboflavin 100 mg twice a day= vitamin B2 Benign Positional Vertigo Vertigo is the feeling that you or your surroundings are moving when they are not. Benign positional vertigo is the most common form of vertigo. The cause of this condition is not serious (is benign). This condition is triggered by certain movements and positions (is positional). This condition can be dangerous if it occurs while you are doing something that could endanger you or others, such as driving. What are the causes? In many cases, the cause of this condition is not known. It may be caused by a disturbance in an area of the inner ear that helps your brain to sense movement and balance. This disturbance can be caused by a viral infection (labyrinthitis), head injury, or repetitive motion. What increases the risk? This condition is more likely to develop in:  Women.  People who are 49 years of age or older.  What are the signs or symptoms? Symptoms of this condition usually happen when you move your head or your eyes in different directions. Symptoms may start suddenly, and they usually last for less than a minute. Symptoms may include:  Loss of balance and falling.  Feeling like you are spinning or moving.  Feeling like your surroundings are spinning or moving.  Nausea and vomiting.  Blurred vision.  Dizziness.  Involuntary eye movement (nystagmus).  Symptoms can be mild and cause only slight annoyance, or they can be severe and interfere with daily life. Episodes of benign positional vertigo may return (recur) over time, and they may be triggered by certain movements. Symptoms may improve over time. How is this diagnosed? This condition is usually diagnosed by medical history and a physical exam of the head, neck, and ears. You may be referred to a health care provider who specializes in ear, nose, and throat (ENT) problems (otolaryngologist) or a provider who specializes  in disorders of the nervous system (neurologist). You may have additional testing, including:  MRI.  A CT scan.  Eye movement tests. Your health care provider may ask you to change positions quickly while he or she watches you for symptoms of benign positional vertigo, such as nystagmus. Eye movement may be tested with an electronystagmogram (ENG), caloric stimulation, the Dix-Hallpike test, or the roll test.  An electroencephalogram (EEG). This records electrical activity in your brain.  Hearing tests.  How is this treated? Usually, your health care provider will treat this by moving your head in specific positions to adjust your inner ear back to normal. Surgery may be needed in severe cases, but this is rare. In some cases, benign positional vertigo may resolve on its own in 2-4 weeks. Follow these instructions at home: Safety  Move slowly.Avoid sudden body or head movements.  Avoid driving.  Avoid operating heavy machinery.  Avoid doing any tasks that would be dangerous to you or others if a vertigo episode would occur.  If you have trouble walking or keeping your balance, try using a cane for stability. If you feel dizzy or unstable, sit down right away.  Return to your normal activities as told by your health care provider. Ask your health care provider what activities are safe for you. General instructions  Take over-the-counter and prescription medicines only as told by your health care provider.  Avoid certain positions or movements as told by your health care provider.  Drink enough fluid to keep your urine clear or pale yellow.  Keep all  follow-up visits as told by your health care provider. This is important. Contact a health care provider if:  You have a fever.  Your condition gets worse or you develop new symptoms.  Your family or friends notice any behavioral changes.  Your nausea or vomiting gets worse.  You have numbness or a "pins and needles"  sensation. Get help right away if:  You have difficulty speaking or moving.  You are always dizzy.  You faint.  You develop severe headaches.  You have weakness in your legs or arms.  You have changes in your hearing or vision.  You develop a stiff neck.  You develop sensitivity to light.

## 2018-10-23 NOTE — Progress Notes (Signed)
PATIENT: Theresa Pacheco DOB: 13-Nov-1971  Chief Complaint  Patient presents with  . Dizziness    Orthostatic Vitals: Lying: 130/88, 80, Sitting: 131/88, 80, Standing: 118/88, 91, Standing x 3 minutes: 125/85, 87.  Reports dizziness that worsens when she first lays down or stands up suddently.   Marland Kitchen Headache    She has been experiencing headaches for the last 6-8 months.  They start at night and occur about two times weekly.  She uses Tylenol for the pain, which is helpful.  Marland Kitchen PCP    Gerre Pebbles, PA-C     HISTORICAL  Hisae Decoursey is a 47 year old female, seen in request by her primary care PA Gerre Pebbles for evaluation of headaches, dizziness, initial evaluation was on October 24, 2018.  I have reviewed and summarized the referring note from the referring physician.  Past medical history of hyperlipidemia, hypothyroidism, on supplement, history of kidney stone  She presented with sudden onset of vertigo Since 2018, Was Diagnosed with Benign Positional Vertigo, had in home vestibular rehabilitation but with limited help, now when she lying down with her head turning towards the right side, she would have transient vertigo, does not have vertigo when she lying to her left side, with sudden positional change, she will occasionally experience transient vertigo.  She denies hearing loss, no gait abnormality.  She had a previous history of migraine headaches, typical migraine are lateralized severe pounding headache with associated light noise sensitivity, nauseous, she is now going through menopause, has irregular menses, since May 2019, she noticed increased frequency of headaches, couple times a week, whole head hurt, with light noise sensitivity, nauseous, Tylenol as needed was helpful.  REVIEW OF SYSTEMS: Full 14 system review of systems performed and notable only for headache, dizziness, snoring, decreased energy, increased thirst, incontinence, spinning sensation,  fatigue  All other review of systems were negative.  ALLERGIES: No Known Allergies  HOME MEDICATIONS: Current Outpatient Medications  Medication Sig Dispense Refill  . acetaminophen (TYLENOL) 500 MG tablet Take 500 mg by mouth as needed.    Marland Kitchen levothyroxine (SYNTHROID, LEVOTHROID) 100 MCG tablet Take 100 mcg by mouth daily.    . raNITIdine HCl (ZANTAC PO) Take by mouth as needed.    . rosuvastatin (CRESTOR) 10 MG tablet Take 10 mg by mouth daily.    . solifenacin (VESICARE) 5 MG tablet Take 5-10 mg by mouth once a week.     No current facility-administered medications for this visit.     PAST MEDICAL HISTORY: Past Medical History:  Diagnosis Date  . Chronic fatigue   . Dizziness   . Hashimoto's disease   . Headache   . History of kidney stones   . Hyperlipemia     PAST SURGICAL HISTORY: Past Surgical History:  Procedure Laterality Date  . DILATION AND CURETTAGE OF UTERUS    . LITHOTRIPSY    . RHINOPLASTY      FAMILY HISTORY: Family History  Problem Relation Age of Onset  . Heart disease Father   . Hypertension Father   . Breast cancer Maternal Aunt   . Alzheimer's disease Maternal Aunt   . Hypertension Mother   . Hyperlipidemia Mother   . Memory loss Mother   . Breast cancer Maternal Grandmother   . Hodgkin's lymphoma Paternal Aunt   . Alzheimer's disease Maternal Aunt     SOCIAL HISTORY: Social History   Socioeconomic History  . Marital status: Married    Spouse name: Not on file  .  Number of children: 1  . Years of education: college  . Highest education level: Bachelor's degree (e.g., BA, AB, BS)  Occupational History  . Occupation: Tree surgeonprogram director  Social Needs  . Financial resource strain: Not on file  . Food insecurity:    Worry: Not on file    Inability: Not on file  . Transportation needs:    Medical: Not on file    Non-medical: Not on file  Tobacco Use  . Smoking status: Never Smoker  . Smokeless tobacco: Never Used  Substance and  Sexual Activity  . Alcohol use: Yes    Comment: 2-3 wine or beer per week  . Drug use: No  . Sexual activity: Yes  Lifestyle  . Physical activity:    Days per week: Not on file    Minutes per session: Not on file  . Stress: Not on file  Relationships  . Social connections:    Talks on phone: Not on file    Gets together: Not on file    Attends religious service: Not on file    Active member of club or organization: Not on file    Attends meetings of clubs or organizations: Not on file    Relationship status: Not on file  . Intimate partner violence:    Fear of current or ex partner: Not on file    Emotionally abused: Not on file    Physically abused: Not on file    Forced sexual activity: Not on file  Other Topics Concern  . Not on file  Social History Narrative   Lives at home with husband and son.   Right-handed.   2 cups caffeine per day.     PHYSICAL EXAM   Vitals:   10/23/18 1441  BP: 130/88  Pulse: 80  Weight: 203 lb (92.1 kg)  Height: 5' 4.25" (1.632 m)    Not recorded      Body mass index is 34.57 kg/m.  PHYSICAL EXAMNIATION:  Gen: NAD, conversant, well nourised, obese, well groomed                     Cardiovascular: Regular rate rhythm, no peripheral edema, warm, nontender. Eyes: Conjunctivae clear without exudates or hemorrhage Neck: Supple, no carotid bruits. Pulmonary: Clear to auscultation bilaterally   NEUROLOGICAL EXAM:  MENTAL STATUS: Speech:    Speech is normal; fluent and spontaneous with normal comprehension.  Cognition:     Orientation to time, place and person     Normal recent and remote memory     Normal Attention span and concentration     Normal Language, naming, repeating,spontaneous speech     Fund of knowledge   CRANIAL NERVES: CN II: Visual fields are full to confrontation. Fundoscopic exam is normal with sharp discs and no vascular changes. Pupils are round equal and briskly reactive to light. CN III, IV, VI:  extraocular movement are normal. No ptosis. CN V: Facial sensation is intact to pinprick in all 3 divisions bilaterally. Corneal responses are intact.  CN VII: Face is symmetric with normal eye closure and smile. CN VIII: Hearing is normal to rubbing fingers CN IX, X: Palate elevates symmetrically. Phonation is normal. CN XI: Head turning and shoulder shrug are intact CN XII: Tongue is midline with normal movements and no atrophy.  MOTOR: There is no pronator drift of out-stretched arms. Muscle bulk and tone are normal. Muscle strength is normal.  REFLEXES: Reflexes are 2+ and symmetric at the biceps,  triceps, knees, and ankles. Plantar responses are flexor.  SENSORY: Intact to light touch, pinprick, positional sensation and vibratory sensation are intact in fingers and toes.  COORDINATION: Rapid alternating movements and fine finger movements are intact. There is no dysmetria on finger-to-nose and heel-knee-shin.    GAIT/STANCE: Posture is normal. Gait is steady with normal steps, base, arm swing, and turning. Heel and toe walking are normal. Tandem gait is normal.  Romberg is absent.   DIAGNOSTIC DATA (LABS, IMAGING, TESTING) - I reviewed patient records, labs, notes, testing and imaging myself where available. Apley's maneuver: Right ear dependent position first noticed transient rotatory nystagmus after short latency, habituate quickly.  ASSESSMENT AND PLAN  Bryauna Byrum is a 47 y.o. female   Benign positional vertigo  I provided information for repositioning maneuver   Chronic migraine headaches  Magnesium oxide 400 mg twice a day, riboflavin 100 mg twice a day as preventive medication  Tylenol or NSAIDs as needed  Levert Feinstein, M.D. Ph.D.  [facility] [address] [place], [place] [address] Ph: [phone] Fax: [phone]  CC: Gerre Pebbles, [place]

## 2018-12-13 ENCOUNTER — Ambulatory Visit: Payer: BC Managed Care – PPO | Admitting: Neurology

## 2018-12-19 ENCOUNTER — Ambulatory Visit: Payer: BC Managed Care – PPO | Admitting: Neurology

## 2019-07-24 ENCOUNTER — Other Ambulatory Visit: Payer: Self-pay | Admitting: Physician Assistant

## 2019-07-24 DIAGNOSIS — Z1231 Encounter for screening mammogram for malignant neoplasm of breast: Secondary | ICD-10-CM

## 2019-09-06 ENCOUNTER — Ambulatory Visit
Admission: RE | Admit: 2019-09-06 | Discharge: 2019-09-06 | Disposition: A | Discharge status: Home / Self Care | Payer: BC Managed Care – PPO | Source: Ambulatory Visit | Attending: Physician Assistant | Admitting: Physician Assistant

## 2019-09-06 ENCOUNTER — Other Ambulatory Visit: Payer: Self-pay

## 2019-09-06 DIAGNOSIS — Z1231 Encounter for screening mammogram for malignant neoplasm of breast: Secondary | ICD-10-CM

## 2020-01-06 ENCOUNTER — Other Ambulatory Visit: Payer: Self-pay | Admitting: Physician Assistant

## 2020-01-06 ENCOUNTER — Telehealth: Payer: Self-pay

## 2020-01-06 DIAGNOSIS — E038 Other specified hypothyroidism: Secondary | ICD-10-CM

## 2020-01-06 MED ORDER — LEVOTHYROXINE SODIUM 88 MCG PO TABS
88.0000 ug | ORAL_TABLET | Freq: Every day | ORAL | 0 refills | Status: DC
Start: 2020-01-06 — End: 2020-01-07

## 2020-01-06 NOTE — Telephone Encounter (Signed)
Pt called  Needs refill of thyroid meds  Send to [facility] in [place]  She will call front desk to make an ov before 02/2020

## 2020-01-07 ENCOUNTER — Other Ambulatory Visit: Payer: Self-pay | Admitting: Physician Assistant

## 2020-01-07 DIAGNOSIS — E038 Other specified hypothyroidism: Secondary | ICD-10-CM

## 2020-01-07 MED ORDER — LEVOTHYROXINE SODIUM 88 MCG PO TABS: 88.0000 ug | ORAL_TABLET | Freq: Every day | ORAL | 0 refills | Status: DC

## 2020-02-03 ENCOUNTER — Ambulatory Visit: Payer: BC Managed Care – PPO | Admitting: Physician Assistant

## 2020-02-11 ENCOUNTER — Encounter: Payer: Self-pay | Admitting: Physician Assistant

## 2020-02-11 ENCOUNTER — Encounter: Payer: Self-pay | Admitting: Gastroenterology

## 2020-02-11 ENCOUNTER — Ambulatory Visit (INDEPENDENT_AMBULATORY_CARE_PROVIDER_SITE_OTHER): Payer: BC Managed Care – PPO | Admitting: Physician Assistant

## 2020-02-11 ENCOUNTER — Other Ambulatory Visit: Payer: Self-pay

## 2020-02-11 VITALS — BP 118/64 | HR 74 | Temp 97.9°F | Resp 16 | Ht 66.0 in | Wt 190.0 lb

## 2020-02-11 DIAGNOSIS — Z1211 Encounter for screening for malignant neoplasm of colon: Secondary | ICD-10-CM | POA: Insufficient documentation

## 2020-02-11 DIAGNOSIS — E782 Mixed hyperlipidemia: Secondary | ICD-10-CM | POA: Diagnosis not present

## 2020-02-11 DIAGNOSIS — E559 Vitamin D deficiency, unspecified: Secondary | ICD-10-CM | POA: Diagnosis not present

## 2020-02-11 DIAGNOSIS — E038 Other specified hypothyroidism: Secondary | ICD-10-CM

## 2020-02-11 DIAGNOSIS — Z1212 Encounter for screening for malignant neoplasm of rectum: Secondary | ICD-10-CM

## 2020-02-11 MED ORDER — ROSUVASTATIN CALCIUM 10 MG PO TABS: 10.0000 mg | ORAL_TABLET | Freq: Every day | ORAL | 1 refills | Status: DC

## 2020-02-11 MED ORDER — SOLIFENACIN SUCCINATE 10 MG PO TABS: 10.0000 mg | ORAL_TABLET | Freq: Every day | ORAL | 1 refills | Status: DC

## 2020-02-11 MED ORDER — VITAMIN D (ERGOCALCIFEROL) 1.25 MG (50000 UNIT) PO CAPS: 50000.0000 [IU] | ORAL_CAPSULE | ORAL | 1 refills | Status: DC

## 2020-02-11 NOTE — Assessment & Plan Note (Signed)
Low fat diet labwork pending Continue current meds

## 2020-02-11 NOTE — Assessment & Plan Note (Signed)
labwork pending Continue current med

## 2020-02-11 NOTE — Progress Notes (Signed)
Established Patient Office Visit  Subjective:  Patient ID: Theresa Pacheco, female    DOB: [birth year]  Age: 49 y.o. MRN: [id number]  CC:  Chief Complaint  Patient presents with     . Hypothyroidism  . Hyperlipidemia    HPI Theresa Pacheco presents for follow up hypothyrodism - she is currently taking synthroid 4mcg qd - voices no problems or concerns - is due for labwork  Pt takes vit D 50000IU weekly - is due for labwork  Pt with history of hyperlipidemia - she is taking crestor 10mg  qd - due for labwork Is trying to watch diet  Past Medical History:  Diagnosis Date  . Chronic fatigue   . Dizziness   . Hashimoto's disease   . Headache   . History of kidney stones   . Hyperlipemia     Past Surgical History:  Procedure Laterality Date  . DILATION AND CURETTAGE OF UTERUS    . LITHOTRIPSY    . RHINOPLASTY      Family History  Problem Relation Age of Onset  . Heart disease Father   . Hypertension Father   . Breast cancer Maternal Aunt   . Alzheimer's disease Maternal Aunt   . Hypertension Mother   . Hyperlipidemia Mother   . Memory loss Mother   . Breast cancer Maternal Grandmother   . Hodgkin's lymphoma Paternal Aunt   . Alzheimer's disease Maternal Aunt     Social History   Socioeconomic History  . Marital status: Married    Spouse name: Not on file  . Number of children: 1  . Years of education: college  . Highest education level: Bachelor's degree (e.g., BA, AB, BS)  Occupational History  . Occupation: Investment banker, operational  [employer]  Tobacco Use  . Smoking status: Never Smoker  . Smokeless tobacco: Never Used  Substance and Sexual Activity  . Alcohol use: Yes    Comment: 2-3 wine or beer per week  . Drug use: No  . Sexual activity: Yes  Other Topics Concern  . Not on file  Social History Narrative   Lives at home with husband and son.   Right-handed.   2 cups caffeine per day.   Social Determinants of Health   Financial Resource  Strain:   . Difficulty of Paying Living Expenses: Not on file  Food Insecurity:   . Worried About Charity fundraiser in the Last Year: Not on file  . Ran Out of Food in the Last Year: Not on file  Transportation Needs:   . Lack of Transportation (Medical): Not on file  . Lack of Transportation (Non-Medical): Not on file  Physical Activity:   . Days of Exercise per Week: Not on file  . Minutes of Exercise per Session: Not on file  Stress:   . Feeling of Stress : Not on file  Social Connections:   . Frequency of Communication with Friends and Family: Not on file  . Frequency of Social Gatherings with Friends and Family: Not on file  . Attends Religious Services: Not on file  . Active Member of Clubs or Organizations: Not on file  . Attends Archivist Meetings: Not on file  . Marital Status: Not on file  Intimate Partner Violence:   . Fear of Current or Ex-Partner: Not on file  . Emotionally Abused: Not on file  . Physically Abused: Not on file  . Sexually Abused: Not on file     Current Outpatient Medications:  .  levothyroxine (SYNTHROID) 88 MCG tablet, Take 1 tablet (88 mcg total) by mouth daily., Disp: 30 tablet, Rfl: 0 .  rosuvastatin (CRESTOR) 10 MG tablet, Take 1 tablet (10 mg total) by mouth daily., Disp: 90 tablet, Rfl: 1 .  solifenacin (VESICARE) 10 MG tablet, Take 1 tablet (10 mg total) by mouth daily., Disp: 90 tablet, Rfl: 1 .  Vitamin D, Ergocalciferol, (DRISDOL) 1.25 MG (50000 UNIT) CAPS capsule, Take 1 capsule (50,000 Units total) by mouth every 7 (seven) days., Disp: 12 capsule, Rfl: 1   No Known Allergies  ROS CONSTITUTIONAL: Negative for chills, fatigue, fever, unintentional weight gain and unintentional weight loss.  E/N/T: Negative for ear pain, nasal congestion and sore throat.  CARDIOVASCULAR: Negative for chest pain, dizziness, palpitations and pedal edema.  RESPIRATORY: Negative for recent cough and dyspnea.  GASTROINTESTINAL: Negative for  abdominal pain, acid reflux symptoms, constipation, diarrhea, nausea and vomiting.  MSK: Negative for arthralgias and myalgias.  INTEGUMENTARY: Negative for rash.  NEUROLOGICAL: Negative for dizziness and headaches.  PSYCHIATRIC: Negative for sleep disturbance and to question depression screen.  Negative for depression, negative for anhedonia.        Objective:    PHYSICAL EXAM:   VS: BP 118/64   Pulse 74   Temp 97.9 F (36.6 C)   Resp 16   Ht 5\' 6"  (1.676 m)   Wt 190 lb (86.2 kg)   SpO2 97%   BMI 30.67 kg/m   GEN: Well nourished, well developed, in no acute distress   Cardiac: RRR; no murmurs, rubs, or gallops,no edema - no significant varicosities Respiratory:  normal respiratory rate and pattern with no distress - normal breath sounds with no rales, rhonchi, wheezes or rubs  MS: no deformity or atrophy  Skin: warm and dry, no rash  Neuro:  Alert and Oriented x 3, Strength and sensation are intact - CN II-Xii grossly intact Psych: euthymic mood, appropriate affect and demeanor  BP 118/64   Pulse 74   Temp 97.9 F (36.6 C)   Resp 16   Ht 5\' 6"  (1.676 m)   Wt 190 lb (86.2 kg)   SpO2 97%   BMI 30.67 kg/m  Wt Readings from Last 3 Encounters:  02/11/20 190 lb (86.2 kg)  10/23/18 203 lb (92.1 kg)  02/10/14 200 lb (90.7 kg)     Health Maintenance Due  Topic Date Due  . HIV Screening  10/25/1984  . PAP SMEAR-Modifier  02/06/2015  . COLONOSCOPY  10/26/2019  . TETANUS/TDAP  11/10/2019    There are no preventive care reminders to display for this patient.  Lab Results  Component Value Date   TSH 2.85 02/06/2012   Lab Results  Component Value Date   WBC 11.6 (H) 02/06/2012   HGB 14.0 02/06/2012   HCT 41.6 02/06/2012   MCV 91.1 02/06/2012   PLT 410.0 (H) 02/06/2012   Lab Results  Component Value Date   NA 138 02/06/2012   K 3.4 (L) 02/06/2012   CO2 30 02/06/2012   GLUCOSE 70 02/06/2012   BUN 17 02/06/2012   CREATININE 0.6 02/06/2012   BILITOT 0.3  02/06/2012   ALKPHOS 54 02/06/2012   AST 18 02/06/2012   ALT 19 02/06/2012   PROT 7.7 02/06/2012   ALBUMIN 4.3 02/06/2012   CALCIUM 9.0 02/06/2012   GFR 118.64 02/06/2012   Lab Results  Component Value Date   CHOL 230 (H) 02/06/2012   Lab Results  Component Value Date   HDL 47.00 02/06/2012  Lab Results  Component Value Date   LDLCALC 100 (H) 02/21/2011   Lab Results  Component Value Date   TRIG 158.0 (H) 02/06/2012   Lab Results  Component Value Date   CHOLHDL 5 02/06/2012   No results found for: HGBA1C    Assessment & Plan:   Problem List Items Addressed This Visit      Endocrine   Other specified hypothyroidism    labwork pending Continue synthroid qd      Relevant Orders   TSH     Other   Mixed hyperlipidemia - Primary    Low fat diet labwork pending Continue current meds      Relevant Medications   rosuvastatin (CRESTOR) 10 MG tablet   Other Relevant Orders   CBC with Differential/Platelet   Comprehensive metabolic panel   Lipid panel   Encounter for colorectal cancer screening   Relevant Orders   Ambulatory referral to Gastroenterology   Vitamin D insufficiency    labwork pending Continue current med      Relevant Orders   VITAMIN D 25 Hydroxy (Vit-D Deficiency, Fractures)      Meds ordered this encounter  Medications  . rosuvastatin (CRESTOR) 10 MG tablet    Sig: Take 1 tablet (10 mg total) by mouth daily.    Dispense:  90 tablet    Refill:  1    Order Specific Question:   Supervising Provider    AnswerBlane Ohara [id number]  . solifenacin (VESICARE) 10 MG tablet    Sig: Take 1 tablet (10 mg total) by mouth daily.    Dispense:  90 tablet    Refill:  1    Order Specific Question:   Supervising Provider    AnswerBlane Ohara [id number]  . Vitamin D, Ergocalciferol, (DRISDOL) 1.25 MG (50000 UNIT) CAPS capsule    Sig: Take 1 capsule (50,000 Units total) by mouth every 7 (seven) days.    Dispense:  12 capsule     Refill:  1    Order Specific Question:   Supervising Provider    Answer:   Corey Harold    Follow-up: Return in about 6 months (around 08/13/2020).    SARA R Carlye Panameno, PA-C

## 2020-02-11 NOTE — Assessment & Plan Note (Signed)
labwork pending Continue synthroid qd

## 2020-02-11 NOTE — Assessment & Plan Note (Signed)
Referral to [facility] 

## 2020-02-13 ENCOUNTER — Other Ambulatory Visit: Payer: Self-pay | Admitting: Physician Assistant

## 2020-02-13 DIAGNOSIS — E038 Other specified hypothyroidism: Secondary | ICD-10-CM

## 2020-02-13 LAB — CBC WITH DIFFERENTIAL/PLATELET
Basophils Absolute: 0.1 10*3/uL (ref 0.0–0.2)
Basos: 1 %
EOS (ABSOLUTE): 0.2 10*3/uL (ref 0.0–0.4)
Eos: 2 %
Hematocrit: 42.2 % (ref 34.0–46.6)
Hemoglobin: 14.3 g/dL (ref 11.1–15.9)
Immature Grans (Abs): 0 10*3/uL (ref 0.0–0.1)
Immature Granulocytes: 0 %
Lymphocytes Absolute: 2.9 10*3/uL (ref 0.7–3.1)
Lymphs: 32 %
MCH: 30.1 pg (ref 26.6–33.0)
MCHC: 33.9 g/dL (ref 31.5–35.7)
MCV: 89 fL (ref 79–97)
Monocytes Absolute: 0.5 10*3/uL (ref 0.1–0.9)
Monocytes: 6 %
Neutrophils Absolute: 5.3 10*3/uL (ref 1.4–7.0)
Neutrophils: 59 %
Platelets: 373 10*3/uL (ref 150–450)
RBC: 4.75 x10E6/uL (ref 3.77–5.28)
RDW: 13.2 % (ref 11.7–15.4)
WBC: 8.9 10*3/uL (ref 3.4–10.8)

## 2020-02-13 LAB — COMPREHENSIVE METABOLIC PANEL
ALT: 23 IU/L (ref 0–32)
AST: 22 IU/L (ref 0–40)
Albumin/Globulin Ratio: 2 (ref 1.2–2.2)
Albumin: 4.8 g/dL (ref 3.8–4.8)
Alkaline Phosphatase: 49 IU/L (ref 39–117)
BUN/Creatinine Ratio: 26 — ABNORMAL HIGH (ref 9–23)
BUN: 18 mg/dL (ref 6–24)
Bilirubin Total: 0.3 mg/dL (ref 0.0–1.2)
CO2: 23 mmol/L (ref 20–29)
Calcium: 9.6 mg/dL (ref 8.7–10.2)
Chloride: 101 mmol/L (ref 96–106)
Creatinine, Ser: 0.69 mg/dL (ref 0.57–1.00)
GFR calc Af Amer: 117 mL/min/{1.73_m2} (ref >59)
GFR calc non Af Amer: 102 mL/min/{1.73_m2} (ref >59)
Globulin, Total: 2.4 g/dL (ref 1.5–4.5)
Glucose: 91 mg/dL (ref 65–99)
Potassium: 4.7 mmol/L (ref 3.5–5.2)
Sodium: 139 mmol/L (ref 134–144)
Total Protein: 7.2 g/dL (ref 6.0–8.5)

## 2020-02-13 LAB — LIPID PANEL
Chol/HDL Ratio: 3.7 ratio (ref 0.0–4.4)
Cholesterol, Total: 147 mg/dL (ref 100–199)
HDL: 40 mg/dL (ref >39)
LDL Chol Calc (NIH): 89 mg/dL (ref 0–99)
Triglycerides: 96 mg/dL (ref 0–149)
VLDL Cholesterol Cal: 18 mg/dL (ref 5–40)

## 2020-02-13 LAB — VITAMIN D 25 HYDROXY (VIT D DEFICIENCY, FRACTURES): Vit D, 25-Hydroxy: 59.3 ng/mL (ref 30.0–100.0)

## 2020-02-13 LAB — CARDIOVASCULAR RISK ASSESSMENT

## 2020-02-13 LAB — TSH: TSH: 4.22 u[IU]/mL (ref 0.450–4.500)

## 2020-02-13 MED ORDER — LEVOTHYROXINE SODIUM 88 MCG PO TABS: 88.0000 ug | ORAL_TABLET | Freq: Every day | ORAL | 1 refills | Status: DC

## 2020-03-10 ENCOUNTER — Ambulatory Visit (INDEPENDENT_AMBULATORY_CARE_PROVIDER_SITE_OTHER): Payer: BC Managed Care – PPO | Admitting: Physician Assistant

## 2020-03-10 ENCOUNTER — Other Ambulatory Visit: Payer: Self-pay

## 2020-03-10 ENCOUNTER — Encounter: Payer: Self-pay | Admitting: Physician Assistant

## 2020-03-10 VITALS — BP 122/78 | HR 68 | Temp 97.3°F | Resp 16 | Ht 67.0 in | Wt 184.6 lb

## 2020-03-10 DIAGNOSIS — R591 Generalized enlarged lymph nodes: Secondary | ICD-10-CM

## 2020-03-10 LAB — CBC WITH DIFFERENTIAL/PLATELET
Basophils Absolute: 0.1 10*3/uL (ref 0.0–0.2)
Basos: 1 %
EOS (ABSOLUTE): 0.3 10*3/uL (ref 0.0–0.4)
Eos: 3 %
Hematocrit: 41.3 % (ref 34.0–46.6)
Hemoglobin: 14.2 g/dL (ref 11.1–15.9)
Immature Grans (Abs): 0 10*3/uL (ref 0.0–0.1)
Immature Granulocytes: 0 %
Lymphocytes Absolute: 2.4 10*3/uL (ref 0.7–3.1)
Lymphs: 28 %
MCH: 30.9 pg (ref 26.6–33.0)
MCHC: 34.4 g/dL (ref 31.5–35.7)
MCV: 90 fL (ref 79–97)
Monocytes Absolute: 0.6 10*3/uL (ref 0.1–0.9)
Monocytes: 7 %
Neutrophils Absolute: 5.3 10*3/uL (ref 1.4–7.0)
Neutrophils: 61 %
Platelets: 320 10*3/uL (ref 150–450)
RBC: 4.6 x10E6/uL (ref 3.77–5.28)
RDW: 13.8 % (ref 11.7–15.4)
WBC: 8.6 10*3/uL (ref 3.4–10.8)

## 2020-03-10 MED ORDER — CEPHALEXIN 500 MG PO CAPS: 500.0000 mg | ORAL_CAPSULE | Freq: Two times a day (BID) | ORAL | 0 refills | Status: DC

## 2020-03-10 NOTE — Progress Notes (Signed)
Acute Office Visit  Subjective:    Patient ID: Theresa Pacheco, female    DOB: 07-04-1971, 49 y.o.   MRN: [id number]  Chief Complaint  Patient presents with  . Swelling of lymph node    HPI Patient is in today for swelling in neck  Patient states that 5 days ago she received her first COVID vaccine in her right arm.  Yesterday she noted tenderness and swelling in the right side of her neck around clavicle area Denies fever, fatigue  Past Medical History:  Diagnosis Date  . Chronic fatigue   . Dizziness   . Hashimoto's disease   . Headache   . History of kidney stones   . Hyperlipemia     Past Surgical History:  Procedure Laterality Date  . DILATION AND CURETTAGE OF UTERUS    . LITHOTRIPSY    . RHINOPLASTY      Family History  Problem Relation Age of Onset  . Heart disease Father   . Hypertension Father   . Breast cancer Maternal Aunt   . Alzheimer's disease Maternal Aunt   . Hypertension Mother   . Hyperlipidemia Mother   . Memory loss Mother   . Breast cancer Maternal Grandmother   . Hodgkin's lymphoma Paternal Aunt   . Alzheimer's disease Maternal Aunt     Social History   Socioeconomic History  . Marital status: Married    Spouse name: Not on file  . Number of children: 1  . Years of education: college  . Highest education level: Bachelor's degree (e.g., BA, AB, BS)  Occupational History  . Occupation: Tree surgeon  [employer]  Tobacco Use  . Smoking status: Never Smoker  . Smokeless tobacco: Never Used  Substance and Sexual Activity  . Alcohol use: Yes    Comment: 2-3 wine or beer per week  . Drug use: No  . Sexual activity: Yes  Other Topics Concern  . Not on file  Social History Narrative   Lives at home with husband and son.   Right-handed.   2 cups caffeine per day.   Social Determinants of Health   Financial Resource Strain:   . Difficulty of Paying Living Expenses:   Food Insecurity:   . Worried About Programme researcher, broadcasting/film/video in  the Last Year:   . Barista in the Last Year:   Transportation Needs:   . Freight forwarder (Medical):   Marland Kitchen Lack of Transportation (Non-Medical):   Physical Activity:   . Days of Exercise per Week:   . Minutes of Exercise per Session:   Stress:   . Feeling of Stress :   Social Connections:   . Frequency of Communication with Friends and Family:   . Frequency of Social Gatherings with Friends and Family:   . Attends Religious Services:   . Active Member of Clubs or Organizations:   . Attends Banker Meetings:   Marland Kitchen Marital Status:   Intimate Partner Violence:   . Fear of Current or Ex-Partner:   . Emotionally Abused:   Marland Kitchen Physically Abused:   . Sexually Abused:      Current Outpatient Medications:  .  levothyroxine (SYNTHROID) 88 MCG tablet, Take 1 tablet (88 mcg total) by mouth daily., Disp: 90 tablet, Rfl: 1 .  rosuvastatin (CRESTOR) 10 MG tablet, Take 1 tablet (10 mg total) by mouth daily., Disp: 90 tablet, Rfl: 1 .  cephALEXin (KEFLEX) 500 MG capsule, Take 1 capsule (500 mg total) by  mouth 2 (two) times daily., Disp: 20 capsule, Rfl: 0 .  solifenacin (VESICARE) 10 MG tablet, Take 1 tablet (10 mg total) by mouth daily., Disp: 90 tablet, Rfl: 1 .  Vitamin D, Ergocalciferol, (DRISDOL) 1.25 MG (50000 UNIT) CAPS capsule, Take 1 capsule (50,000 Units total) by mouth every 7 (seven) days., Disp: 12 capsule, Rfl: 1   No Known Allergies  CONSTITUTIONAL: Negative for chills, fatigue, fever, unintentional weight gain and unintentional weight loss.   CARDIOVASCULAR: Negative for chest pain, dizziness, palpitations and pedal edema.  RESPIRATORY: Negative for recent cough and dyspnea.   MSK: Negative for arthralgias and myalgias.  INTEGUMENTARY: see HPI      Objective:    PHYSICAL EXAM:   VS: BP 122/78   Pulse 68   Temp (!) 97.3 F (36.3 C)   Resp 16   Ht 5\' 7"  (1.702 m)   Wt 184 lb 9.6 oz (83.7 kg)   SpO2 98%   BMI 28.91 kg/m   GEN: Well  nourished, well developed, in no acute distress   Neck: lymphadenopathy noted above right clavicle - tender to palpation Cardiac: RRR; no murmurs, rubs, or gallops,no edema - no significant varicosities Respiratory:  normal respiratory rate and pattern with no distress - normal breath sounds with no rales, rhonchi, wheezes or rubs  Skin: warm and dry, no rash     Wt Readings from Last 3 Encounters:  03/10/20 184 lb 9.6 oz (83.7 kg)  02/11/20 190 lb (86.2 kg)  10/23/18 203 lb (92.1 kg)    Health Maintenance Due  Topic Date Due  . HIV Screening  Never done  . PAP SMEAR-Modifier  02/06/2015  . COLONOSCOPY  Never done  . TETANUS/TDAP  11/10/2019    There are no preventive care reminders to display for this patient.        Assessment & Plan:   Problem List Items Addressed This Visit      Immune and Lymphatic   Lymphadenopathy of head and neck - Primary    Cbc pending Recommend ibuprofen rx for keflex bid Follow up if symptoms persist or worsen      Relevant Orders   CBC with Differential/Platelet       Meds ordered this encounter  Medications  . cephALEXin (KEFLEX) 500 MG capsule    Sig: Take 1 capsule (500 mg total) by mouth 2 (two) times daily.    Dispense:  20 capsule    Refill:  0    Order Specific Question:   Supervising Provider    Answer:   COX, KIRSTEN [[id number]]     [place], PA-C

## 2020-03-10 NOTE — Assessment & Plan Note (Signed)
Cbc pending Recommend ibuprofen rx for keflex bid Follow up if symptoms persist or worsen

## 2020-03-16 ENCOUNTER — Ambulatory Visit (AMBULATORY_SURGERY_CENTER): Payer: Self-pay | Admitting: *Deleted

## 2020-03-16 ENCOUNTER — Other Ambulatory Visit: Payer: Self-pay

## 2020-03-16 VITALS — Temp 97.5°F | Ht 66.0 in | Wt 189.8 lb

## 2020-03-16 DIAGNOSIS — Z1211 Encounter for screening for malignant neoplasm of colon: Secondary | ICD-10-CM

## 2020-03-16 NOTE — Progress Notes (Signed)
Patient denies any allergies to egg or soy products. Patient denies complications with anesthesia/sedation.  Patient denies oxygen use at home and denies diet medications. Emmi instructions for colonoscopy explained and given to patient.  

## 2020-03-30 ENCOUNTER — Encounter: Payer: BC Managed Care – PPO | Admitting: Gastroenterology

## 2020-04-17 ENCOUNTER — Encounter: Payer: Self-pay | Admitting: Gastroenterology

## 2020-04-24 ENCOUNTER — Ambulatory Visit (AMBULATORY_SURGERY_CENTER): Payer: BC Managed Care – PPO | Admitting: Gastroenterology

## 2020-04-24 ENCOUNTER — Encounter: Payer: Self-pay | Admitting: Gastroenterology

## 2020-04-24 ENCOUNTER — Other Ambulatory Visit: Payer: Self-pay

## 2020-04-24 VITALS — BP 108/76 | HR 66 | Temp 97.3°F | Resp 15 | Ht 66.0 in | Wt 189.8 lb

## 2020-04-24 DIAGNOSIS — Z1211 Encounter for screening for malignant neoplasm of colon: Secondary | ICD-10-CM | POA: Diagnosis not present

## 2020-04-24 MED ORDER — SODIUM CHLORIDE 0.9 % IV SOLN: 500.0000 mL | Freq: Once | INTRAVENOUS | Status: DC

## 2020-04-24 NOTE — Patient Instructions (Signed)
HANDOUTS PROVIDED ON: HIGH FIBER DIET, DIVERTICULOSIS, & HEMORRHOIDS   You may resume your previous medication schedule.  Follow a high fiber diet.  Drink at least 64 oz of water daily.  Add a daily stool bulking agent such as Metamucil.  Thank you for allowing us to care for you today!!!   YOU HAD AN ENDOSCOPIC PROCEDURE TODAY AT THE [facility]:   Refer to the procedure report that was given to you for any specific questions about what was found during the examination.  If the procedure report does not answer your questions, please call your gastroenterologist to clarify.  If you requested that your care partner not be given the details of your procedure findings, then the procedure report has been included in a sealed envelope for you to review at your convenience later.  YOU SHOULD EXPECT: Some feelings of bloating in the abdomen. Passage of more gas than usual.  Walking can help get rid of the air that was put into your GI tract during the procedure and reduce the bloating. If you had a lower endoscopy (such as a colonoscopy or flexible sigmoidoscopy) you may notice spotting of blood in your stool or on the toilet paper. If you underwent a bowel prep for your procedure, you may not have a normal bowel movement for a few days.  Please Note:  You might notice some irritation and congestion in your nose or some drainage.  This is from the oxygen used during your procedure.  There is no need for concern and it should clear up in a day or so.  SYMPTOMS TO REPORT IMMEDIATELY:   Following lower endoscopy (colonoscopy or flexible sigmoidoscopy):  Excessive amounts of blood in the stool  Significant tenderness or worsening of abdominal pains  Swelling of the abdomen that is new, acute  Fever of 100F or higher  For urgent or emergent issues, a gastroenterologist can be reached at any hour by calling [phone]. Do not use MyChart messaging for urgent concerns.    DIET:  We do  recommend a small meal at first, but then you may proceed to your regular diet.  Drink plenty of fluids but you should avoid alcoholic beverages for 24 hours.  ACTIVITY:  You should plan to take it easy for the rest of today and you should NOT DRIVE or use heavy machinery until tomorrow (because of the sedation medicines used during the test).    FOLLOW UP: Our staff will call the number listed on your records 48-72 hours following your procedure to check on you and address any questions or concerns that you may have regarding the information given to you following your procedure. If we do not reach you, we will leave a message.  We will attempt to reach you two times.  During this call, we will ask if you have developed any symptoms of COVID 19. If you develop any symptoms (ie: fever, flu-like symptoms, shortness of breath, cough etc.) before then, please call [phone].  If you test positive for Covid 19 in the 2 weeks post procedure, please call and report this information to us.    If any biopsies were taken you will be contacted by phone or by letter within the next 1-3 weeks.  Please call us at [phone] if you have not heard about the biopsies in 3 weeks.    SIGNATURES/CONFIDENTIALITY: You and/or your care partner have signed paperwork which will be entered into your electronic medical record.  These   signatures attest to the fact that that the information above on your After Visit Summary has been reviewed and is understood.  Full responsibility of the confidentiality of this discharge information lies with you and/or your care-partner. 

## 2020-04-24 NOTE — Progress Notes (Signed)
Called to room to assist during endoscopic procedure.  Patient ID and intended procedure confirmed with present staff. Received instructions for my participation in the procedure from the performing physician.  

## 2020-04-24 NOTE — Op Note (Signed)
[facility] Patient Name: Theresa Pacheco Procedure Date: 04/24/2020 1:27 PM MRN: [id number] Endoscopist: Tressia Danas MD, MD Age: 49 Referring MD:  Date of Birth: January 31, 1971 Gender: Female Account #: [id number] Procedure:                Colonoscopy Indications:              Screening for colorectal malignant neoplasm, This                            is the patient's first colonoscopy                           No known family history of colon cancer or polyps Medicines:                Monitored Anesthesia Care Procedure:                Pre-Anesthesia Assessment:                           - Prior to the procedure, a History and Physical                            was performed, and patient medications and                            allergies were reviewed. The patient's tolerance of                            previous anesthesia was also reviewed. The risks                            and benefits of the procedure and the sedation                            options and risks were discussed with the patient.                            All questions were answered, and informed consent                            was obtained. Prior Anticoagulants: The patient has                            taken no previous anticoagulant or antiplatelet                            agents. ASA Grade Assessment: II - A patient with                            mild systemic disease. After reviewing the risks                            and benefits, the patient was deemed in  satisfactory condition to undergo the procedure.                           After obtaining informed consent, the colonoscope                            was passed under direct vision. Throughout the                            procedure, the patient's blood pressure, pulse, and                            oxygen saturations were monitored continuously. The                            Colonoscope was  introduced through the anus and                            advanced to the 4 cm into the ileum. A second                            forward view of the right colon was performed. The                            colonoscopy was performed without difficulty. The                            patient tolerated the procedure well. The quality                            of the bowel preparation was good. The terminal                            ileum, ileocecal valve, appendiceal orifice, and                            rectum were photographed. Scope In: 1:34:14 PM Scope Out: 1:50:06 PM Scope Withdrawal Time: 0 hours 12 minutes 26 seconds  Total Procedure Duration: 0 hours 15 minutes 52 seconds  Findings:                 Hemorrhoids and a skin tag were found on perianal                            exam.                           A few small-mouthed diverticula were found in the                            sigmoid colon.                           Non-bleeding internal hemorrhoids were found. The  hemorrhoids were small.                           The exam was otherwise without abnormality on                            direct and retroflexion views. Complications:            No immediate complications. Estimated Blood Loss:     Estimated blood loss: none. Impression:               - Hemorrhoids and a skin tag found on perianal exam.                           - Diverticulosis in the sigmoid colon.                           - Non-bleeding internal hemorrhoids.                           - The examination was otherwise normal on direct                            and retroflexion views.                           - No specimens collected. Recommendation:           - Patient has a contact number available for                            emergencies. The signs and symptoms of potential                            delayed complications were discussed with the                             patient. Return to normal activities tomorrow.                            Written discharge instructions were provided to the                            patient.                           - Follow a high fiber diet. Drink at least 64                            ounces of water daily. Add a daily stool bulking                            agent such as psyllium (an exampled would be                            Metamucil).                           -  Continue present medications.                           - Repeat colonoscopy in 10 years for screening                            purposes, earlier with any new symptoms.                           - Emerging evidence supports eating a diet of                            fruits, vegetables, grains, calcium, and yogurt                            while reducing red meat and alcohol may reduce the                            risk of colon cancer.                           - Thank you for allowing me to be involved in your                            colon cancer prevention. Tressia Danas MD, MD 04/24/2020 1:57:18 PM This report has been signed electronically.

## 2020-04-24 NOTE — Progress Notes (Signed)
Report given to PACU, vss 

## 2020-04-24 NOTE — Progress Notes (Signed)
VS- Theresa Pacheco  Pt's states no medical or surgical changes since previsit or office visit.

## 2020-04-28 ENCOUNTER — Telehealth: Payer: Self-pay

## 2020-04-28 NOTE — Telephone Encounter (Signed)
1st follow up call made.  NALM 

## 2020-04-28 NOTE — Telephone Encounter (Signed)
Left message on follow up call. 

## 2020-07-20 ENCOUNTER — Other Ambulatory Visit: Payer: Self-pay | Admitting: Physician Assistant

## 2020-07-20 DIAGNOSIS — E038 Other specified hypothyroidism: Secondary | ICD-10-CM

## 2020-08-04 ENCOUNTER — Telehealth (INDEPENDENT_AMBULATORY_CARE_PROVIDER_SITE_OTHER): Payer: BC Managed Care – PPO | Admitting: Physician Assistant

## 2020-08-04 ENCOUNTER — Encounter: Payer: Self-pay | Admitting: Physician Assistant

## 2020-08-04 VITALS — Temp 97.1°F | Ht 66.0 in | Wt 180.2 lb

## 2020-08-04 DIAGNOSIS — J028 Acute pharyngitis due to other specified organisms: Secondary | ICD-10-CM

## 2020-08-04 DIAGNOSIS — J029 Acute pharyngitis, unspecified: Secondary | ICD-10-CM | POA: Insufficient documentation

## 2020-08-04 LAB — POCT RAPID STREP A (OFFICE): Rapid Strep A Screen: NEGATIVE

## 2020-08-04 NOTE — Progress Notes (Signed)
Virtual Visit via Telephone Note   This visit type was conducted due to national recommendations for restrictions regarding the COVID-19 Pandemic (e.g. social distancing) in an effort to limit this patient's exposure and mitigate transmission in our community.  Due to her co-morbid illnesses, this patient is at least at moderate risk for complications without adequate follow up.  This format is felt to be most appropriate for this patient at this time.  The patient did not have access to video technology/had technical difficulties with video requiring transitioning to audio format only (telephone).  All issues noted in this document were discussed and addressed.  No physical exam could be performed with this format.  Patient verbally consented to a telehealth visit.   Date:  08/04/2020   ID:  Theresa Pacheco, DOB 28-Dec-1970, MRN [id number]  Patient Location: Home Provider Location: Office  PCP:  Marianne Sofia, PA-C    Chief Complaint:  pharyngitis  History of Present Illness:    Theresa Pacheco is a 49 y.o. female with pharyngitis - pt states that since yesterday she has had headache, fever and malaise - son was tested for COVID yesterday and results still pending - she has not had cough or congestion - still has taste and smell States throat looks normal with minimal erythema  The patient does have symptoms concerning for COVID-19 infection (fever, chills, cough, or new shortness of breath).    Past Medical History:  Diagnosis Date  . Chronic fatigue    no current problem  . Dizziness    no current problems  . Hashimoto's disease   . Headache    no current problem  . History of kidney stones    surgery to remove  . Hyperlipemia    Past Surgical History:  Procedure Laterality Date  . DILATION AND CURETTAGE OF UTERUS    . LITHOTRIPSY    . RHINOPLASTY  1986     Current Meds  Medication Sig  . rosuvastatin (CRESTOR) 10 MG tablet TAKE ONE TABLET BY MOUTH EVERY DAY   . SYNTHROID 88 MCG tablet TAKE 1 TABLET BY MOUTH ONCE DAILY  . Vitamin D, Ergocalciferol, (DRISDOL) 1.25 MG (50000 UNIT) CAPS capsule Take 1 capsule (50,000 Units total) by mouth every 7 (seven) days.     Allergies:   Patient has no known allergies.   Social History   Tobacco Use  . Smoking status: Never Smoker  . Smokeless tobacco: Never Used  Vaping Use  . Vaping Use: Never used  Substance Use Topics  . Alcohol use: Yes    Alcohol/week: 1.0 - 2.0 standard drink    Types: 1 - 2 Standard drinks or equivalent per week    Comment: beer/wine ocassionally  . Drug use: No     Family Hx: The patient's family history includes Alzheimer's disease in her maternal aunt and maternal aunt; Breast cancer in her maternal aunt and maternal grandmother; Heart disease in her father; Hodgkin's lymphoma in her paternal aunt; Hyperlipidemia in her mother; Hypertension in her father and mother; Memory loss in her mother. There is no history of Colon cancer, Rectal cancer, Stomach cancer, or Esophageal cancer.  ROS:   Please see the history of present illness.    All other systems reviewed and are negative.  Labs/Other Tests and Data Reviewed:    Recent Labs: 02/11/2020: ALT 23; BUN 18; Creatinine, Ser 0.69; Potassium 4.7; Sodium 139; TSH 4.220 03/10/2020: Hemoglobin 14.2; Platelets 320   Recent Lipid Panel Lab Results  Component Value Date/Time   CHOL 147 02/11/2020 08:41 AM   TRIG 96 02/11/2020 08:41 AM   HDL 40 02/11/2020 08:41 AM   CHOLHDL 3.7 02/11/2020 08:41 AM   CHOLHDL 5 02/06/2012 11:49 AM   LDLCALC 89 02/11/2020 08:41 AM   LDLDIRECT 155.8 02/06/2012 11:49 AM    Wt Readings from Last 3 Encounters:  08/04/20 180 lb 3.2 oz (81.7 kg)  04/24/20 189 lb 12.8 oz (86.1 kg)  03/16/20 189 lb 12.8 oz (86.1 kg)     Objective:    Vital Signs:  Temp (!) 97.1 F (36.2 C)   Ht 5\' 6"  (1.676 m)   Wt 180 lb 3.2 oz (81.7 kg)   BMI 29.09 kg/m    VITAL SIGNS:  reviewed Video Visit on  08/04/2020  Component Date Value Ref Range Status  . Rapid Strep A Screen 08/04/2020 Negative  Negative Final    ASSESSMENT & PLAN:    1. Pharyngitis --- recommend rest, fluids and tylenol as directed   COVID-19 Education: The signs and symptoms of COVID-19 were discussed with the patient and how to seek care for testing (follow up with PCP or arrange E-visit). The importance of social distancing was discussed today.  Time:   Today, I have spent 10 minutes with the patient with telehealth technology discussing the above problems.     Medication Adjustments/Labs and Tests Ordered: Current medicines are reviewed at length with the patient today.  Concerns regarding medicines are outlined above.   Tests Ordered: Orders Placed This Encounter  Procedures  . Novel Coronavirus, NAA ([facility])  . POCT rapid strep A    Medication Changes: No orders of the defined types were placed in this encounter.   Follow Up:  Virtual Visit  prn  Signed, 08/06/2020, PA-C  08/04/2020 11:33 AM    [facility] [place]

## 2020-08-04 NOTE — Assessment & Plan Note (Signed)
Tylenol, rest , fluids

## 2020-08-06 LAB — NOVEL CORONAVIRUS, NAA: SARS-CoV-2, NAA: NOT DETECTED

## 2020-08-13 ENCOUNTER — Ambulatory Visit: Payer: BC Managed Care – PPO | Admitting: Physician Assistant

## 2020-08-13 ENCOUNTER — Encounter: Payer: Self-pay | Admitting: Physician Assistant

## 2020-08-13 ENCOUNTER — Other Ambulatory Visit: Payer: Self-pay

## 2020-08-13 VITALS — BP 112/72 | HR 71 | Temp 97.7°F | Ht 67.0 in | Wt 181.0 lb

## 2020-08-13 DIAGNOSIS — E038 Other specified hypothyroidism: Secondary | ICD-10-CM | POA: Diagnosis not present

## 2020-08-13 DIAGNOSIS — E559 Vitamin D deficiency, unspecified: Secondary | ICD-10-CM | POA: Diagnosis not present

## 2020-08-13 DIAGNOSIS — E782 Mixed hyperlipidemia: Secondary | ICD-10-CM | POA: Diagnosis not present

## 2020-08-13 NOTE — Assessment & Plan Note (Signed)
Continue current meds as directed labwork pending 

## 2020-08-13 NOTE — Progress Notes (Addendum)
Established Patient Office Visit  Subjective:  Patient ID: Theresa Pacheco, female    DOB: Jun 10, 1971  Age: 49 y.o. MRN: [id number]  CC:  Chief Complaint  Patient presents with     . Hypothyroidism  . Hyperlipidemia    HPI Theresa Pacheco presents for follow up hypothyrodism - she is currently taking synthroid qd - voices no problems or concerns - is due for labwork  Pt takes vit D 50000IU weekly - is due for labwork  Pt with history of hyperlipidemia - she is taking crestor 10mg  qd - due for labwork Is trying to watch diet  Past Medical History:  Diagnosis Date  . Chronic fatigue    no current problem  . Dizziness    no current problems  . Hashimoto's disease   . Headache    no current problem  . History of kidney stones    surgery to remove  . Hyperlipemia     Past Surgical History:  Procedure Laterality Date  . DILATION AND CURETTAGE OF UTERUS    . LITHOTRIPSY    . RHINOPLASTY  1986    Family History  Problem Relation Age of Onset  . Heart disease Father   . Hypertension Father   . Breast cancer Maternal Aunt   . Alzheimer's disease Maternal Aunt   . Hypertension Mother   . Hyperlipidemia Mother   . Memory loss Mother   . Breast cancer Maternal Grandmother   . Hodgkin's lymphoma Paternal Aunt   . Alzheimer's disease Maternal Aunt   . Colon cancer Neg Hx   . Rectal cancer Neg Hx   . Stomach cancer Neg Hx   . Esophageal cancer Neg Hx     Social History   Socioeconomic History  . Marital status: Married    Spouse name: Not on file  . Number of children: 1  . Years of education: college  . Highest education level: Bachelor's degree (e.g., BA, AB, BS)  Occupational History  . Occupation:  [employer]  Tobacco Use  . Smoking status: Never Smoker  . Smokeless tobacco: Never Used  Vaping Use  . Vaping Use: Never used  Substance and Sexual Activity  . Alcohol use: Yes    Alcohol/week: 1.0 - 2.0 standard drink     Types: 1 - 2 Standard drinks or equivalent per week    Comment: beer/wine ocassionally  . Drug use: No  . Sexual activity: Yes    Partners: Male    Birth control/protection: None  Other Topics Concern  . Not on file  Social History Narrative   Lives at home with husband and son.   Right-handed.   2 cups caffeine per day.   Social Determinants of Health   Financial Resource Strain:   . Difficulty of Paying Living Expenses: Not on file  Food Insecurity:   . Worried About Tree surgeon in the Last Year: Not on file  . Ran Out of Food in the Last Year: Not on file  Transportation Needs:   . Lack of Transportation (Medical): Not on file  . Lack of Transportation (Non-Medical): Not on file  Physical Activity:   . Days of Exercise per Week: Not on file  . Minutes of Exercise per Session: Not on file  Stress:   . Feeling of Stress : Not on file  Social Connections:   . Frequency of Communication with Friends and Family: Not on file  . Frequency of Social Gatherings with  Friends and Family: Not on file  . Attends Religious Services: Not on file  . Active Member of Clubs or Organizations: Not on file  . Attends Banker Meetings: Not on file  . Marital Status: Not on file  Intimate Partner Violence:   . Fear of Current or Ex-Partner: Not on file  . Emotionally Abused: Not on file  . Physically Abused: Not on file  . Sexually Abused: Not on file     Current Outpatient Medications:  .  rosuvastatin (CRESTOR) 10 MG tablet, TAKE ONE TABLET BY MOUTH EVERY DAY, Disp: 90 tablet, Rfl: 1 .  SYNTHROID 88 MCG tablet, TAKE 1 TABLET BY MOUTH ONCE DAILY, Disp: 90 tablet, Rfl: 1 .  Vitamin D, Ergocalciferol, (DRISDOL) 1.25 MG (50000 UNIT) CAPS capsule, Take 1 capsule (50,000 Units total) by mouth every 7 (seven) days., Disp: 12 capsule, Rfl: 1   No Known Allergies  ROS CONSTITUTIONAL: Negative for chills, fatigue, fever, unintentional weight gain and unintentional weight  loss.  CARDIOVASCULAR: Negative for chest pain, dizziness, palpitations and pedal edema.  RESPIRATORY: Negative for recent cough and dyspnea.  INTEGUMENTARY: Negative for rash.  NEUROLOGICAL: Negative for dizziness and headaches.  PSYCHIATRIC: Negative for sleep disturbance and to question depression screen.  Negative for depression, negative for anhedonia.        Objective:    PHYSICAL EXAM:   VS: BP 112/72 (BP Location: Left Arm, Patient Position: Sitting)   Pulse 71   Temp 97.7 F (36.5 C) (Temporal)   Ht 5\' 7"  (1.702 m)   Wt 181 lb (82.1 kg)   SpO2 99%   BMI 28.35 kg/m   GEN: Well nourished, well developed, in no acute distress   Cardiac: RRR; no murmurs, rubs, or gallops,no edema - no significant varicosities Respiratory:  normal respiratory rate and pattern with no distress - normal breath sounds with no rales, rhonchi, wheezes or rubs  Neuro:  Alert and Oriented x 3, Strength and sensation are intact - CN II-Xii grossly intact Psych: euthymic mood, appropriate affect and demeanor  BP 112/72 (BP Location: Left Arm, Patient Position: Sitting)   Pulse 71   Temp 97.7 F (36.5 C) (Temporal)   Ht 5\' 7"  (1.702 m)   Wt 181 lb (82.1 kg)   SpO2 99%   BMI 28.35 kg/m  Wt Readings from Last 3 Encounters:  08/13/20 181 lb (82.1 kg)  08/04/20 180 lb 3.2 oz (81.7 kg)  04/24/20 189 lb 12.8 oz (86.1 kg)     Health Maintenance Due  Topic Date Due  . TETANUS/TDAP  11/10/2019    There are no preventive care reminders to display for this patient.  Lab Results  Component Value Date   TSH 4.220 02/11/2020   Lab Results  Component Value Date   WBC 8.6 03/10/2020   HGB 14.2 03/10/2020   HCT 41.3 03/10/2020   MCV 90 03/10/2020   PLT 320 03/10/2020   Lab Results  Component Value Date   NA 139 02/11/2020   K 4.7 02/11/2020   CO2 23 02/11/2020   GLUCOSE 91 02/11/2020   BUN 18 02/11/2020   CREATININE 0.69 02/11/2020   BILITOT 0.3 02/11/2020   ALKPHOS 49 02/11/2020    AST 22 02/11/2020   ALT 23 02/11/2020   PROT 7.2 02/11/2020   ALBUMIN 4.8 02/11/2020   CALCIUM 9.6 02/11/2020   GFR 118.64 02/06/2012   Lab Results  Component Value Date   CHOL 147 02/11/2020   Lab Results  Component  Value Date   HDL 40 02/11/2020   Lab Results  Component Value Date   LDLCALC 89 02/11/2020   Lab Results  Component Value Date   TRIG 96 02/11/2020   Lab Results  Component Value Date   CHOLHDL 3.7 02/11/2020   No results found for: HGBA1C    Assessment & Plan:   Problem List Items Addressed This Visit      Endocrine   Other specified hypothyroidism - Primary    Continue current meds as directed labwork pending      Relevant Orders   TSH     Other   Mixed hyperlipidemia    Well controlled.  No changes to medicines.  Continue to work on eating a healthy diet and exercise.  Labs drawn today.        Relevant Orders   CBC with Differential/Platelet   Comprehensive metabolic panel   Lipid panel   Vitamin D insufficiency    labwork pending      Relevant Orders   VITAMIN D 25 Hydroxy (Vit-D Deficiency, Fractures)      No orders of the defined types were placed in this encounter.   Follow-up: Return in about 6 months (around 02/10/2021) for chronic fasting follow up.    SARA R Naturi Alarid, PA-C

## 2020-08-13 NOTE — Assessment & Plan Note (Signed)
Well controlled.  ?No changes to medicines.  ?Continue to work on eating a healthy diet and exercise.  ?Labs drawn today.  ?

## 2020-08-13 NOTE — Assessment & Plan Note (Signed)
labwork pending 

## 2020-08-14 ENCOUNTER — Other Ambulatory Visit: Payer: Self-pay | Admitting: Physician Assistant

## 2020-08-14 DIAGNOSIS — Z1231 Encounter for screening mammogram for malignant neoplasm of breast: Secondary | ICD-10-CM

## 2020-08-14 LAB — COMPREHENSIVE METABOLIC PANEL
ALT: 16 IU/L (ref 0–32)
AST: 14 IU/L (ref 0–40)
Albumin/Globulin Ratio: 1.9 (ref 1.2–2.2)
Albumin: 4.9 g/dL — ABNORMAL HIGH (ref 3.8–4.8)
Alkaline Phosphatase: 63 IU/L (ref 48–121)
BUN/Creatinine Ratio: 32 — ABNORMAL HIGH (ref 9–23)
BUN: 24 mg/dL (ref 6–24)
Bilirubin Total: 0.4 mg/dL (ref 0.0–1.2)
CO2: 25 mmol/L (ref 20–29)
Calcium: 9.8 mg/dL (ref 8.7–10.2)
Chloride: 101 mmol/L (ref 96–106)
Creatinine, Ser: 0.76 mg/dL (ref 0.57–1.00)
GFR calc Af Amer: 106 mL/min/{1.73_m2} (ref >59)
GFR calc non Af Amer: 92 mL/min/{1.73_m2} (ref >59)
Globulin, Total: 2.6 g/dL (ref 1.5–4.5)
Glucose: 86 mg/dL (ref 65–99)
Potassium: 4.4 mmol/L (ref 3.5–5.2)
Sodium: 142 mmol/L (ref 134–144)
Total Protein: 7.5 g/dL (ref 6.0–8.5)

## 2020-08-14 LAB — CBC WITH DIFFERENTIAL/PLATELET
Basophils Absolute: 0.1 10*3/uL (ref 0.0–0.2)
Basos: 1 %
EOS (ABSOLUTE): 0.3 10*3/uL (ref 0.0–0.4)
Eos: 3 %
Hematocrit: 42.5 % (ref 34.0–46.6)
Hemoglobin: 14.1 g/dL (ref 11.1–15.9)
Immature Grans (Abs): 0 10*3/uL (ref 0.0–0.1)
Immature Granulocytes: 0 %
Lymphocytes Absolute: 3.7 10*3/uL — ABNORMAL HIGH (ref 0.7–3.1)
Lymphs: 37 %
MCH: 30.4 pg (ref 26.6–33.0)
MCHC: 33.2 g/dL (ref 31.5–35.7)
MCV: 92 fL (ref 79–97)
Monocytes Absolute: 0.5 10*3/uL (ref 0.1–0.9)
Monocytes: 5 %
Neutrophils Absolute: 5.5 10*3/uL (ref 1.4–7.0)
Neutrophils: 54 %
Platelets: 378 10*3/uL (ref 150–450)
RBC: 4.64 x10E6/uL (ref 3.77–5.28)
RDW: 13.1 % (ref 11.7–15.4)
WBC: 10.2 10*3/uL (ref 3.4–10.8)

## 2020-08-14 LAB — LIPID PANEL
Chol/HDL Ratio: 3.6 ratio (ref 0.0–4.4)
Cholesterol, Total: 185 mg/dL (ref 100–199)
HDL: 52 mg/dL (ref >39)
LDL Chol Calc (NIH): 115 mg/dL — ABNORMAL HIGH (ref 0–99)
Triglycerides: 102 mg/dL (ref 0–149)
VLDL Cholesterol Cal: 18 mg/dL (ref 5–40)

## 2020-08-14 LAB — CARDIOVASCULAR RISK ASSESSMENT

## 2020-08-14 LAB — TSH: TSH: 3.63 u[IU]/mL (ref 0.450–4.500)

## 2020-08-14 LAB — VITAMIN D 25 HYDROXY (VIT D DEFICIENCY, FRACTURES): Vit D, 25-Hydroxy: 50.2 ng/mL (ref 30.0–100.0)

## 2020-09-10 ENCOUNTER — Other Ambulatory Visit: Payer: Self-pay

## 2020-09-10 ENCOUNTER — Ambulatory Visit
Admission: RE | Admit: 2020-09-10 | Discharge: 2020-09-10 | Disposition: A | Discharge status: Home / Self Care | Payer: BC Managed Care – PPO | Source: Ambulatory Visit | Attending: Physician Assistant | Admitting: Physician Assistant

## 2020-09-10 DIAGNOSIS — Z1231 Encounter for screening mammogram for malignant neoplasm of breast: Secondary | ICD-10-CM

## 2020-10-09 ENCOUNTER — Telehealth: Payer: Self-pay

## 2020-10-09 NOTE — Telephone Encounter (Signed)
Dr. Sedalia Muta please advise.  Pt is a sally pt who said she started having knotting and cramping in her stomach since yesterday. Shes also had chills an constant headache an a low grade at 99.5. Pt is fully covid vaccinated and she had her flu shot 09/16/2020. Pt has a covid test schedule this morning at the [facility] at 10:15am. Pt is worried about it being her gallbladder or her appendix. No diarrhea or vomiting just cramping. Pt states she hasn't had a period in years. She wanted me to send you a message and see if she should come in an be seen, pt is aware that we are only open to 12 today.

## 2020-10-09 NOTE — Telephone Encounter (Signed)
Presents complaining of fever 99.5 this am. Has had chills and constant headache. Pt is having some crampy abdominal pain. No bladder or vaginal sxs. Is not on her period. No nausea, vomiting, diarrhea, cough, sob, EA, ST.  Her abdominal pain has improved.  Got covid 19 test pcr at [facility] this morning.  Educated about sympoms of appendicitis and cholecystitis.  Recommended quarantine and await testing.  If abdominal pain worsens, pt recommended to go to Urgent care.  Kc

## 2021-02-02 ENCOUNTER — Other Ambulatory Visit: Payer: Self-pay | Admitting: Physician Assistant

## 2021-02-02 DIAGNOSIS — E038 Other specified hypothyroidism: Secondary | ICD-10-CM

## 2021-02-16 ENCOUNTER — Other Ambulatory Visit: Payer: Self-pay

## 2021-02-16 ENCOUNTER — Ambulatory Visit: Payer: BC Managed Care – PPO | Admitting: Physician Assistant

## 2021-02-16 ENCOUNTER — Encounter: Payer: Self-pay | Admitting: Physician Assistant

## 2021-02-16 VITALS — BP 106/70 | HR 68 | Temp 97.5°F | Resp 16 | Ht 64.5 in | Wt 197.0 lb

## 2021-02-16 DIAGNOSIS — E038 Other specified hypothyroidism: Secondary | ICD-10-CM

## 2021-02-16 DIAGNOSIS — E559 Vitamin D deficiency, unspecified: Secondary | ICD-10-CM | POA: Diagnosis not present

## 2021-02-16 DIAGNOSIS — R0683 Snoring: Secondary | ICD-10-CM

## 2021-02-16 DIAGNOSIS — Z23 Encounter for immunization: Secondary | ICD-10-CM

## 2021-02-16 DIAGNOSIS — E782 Mixed hyperlipidemia: Secondary | ICD-10-CM

## 2021-02-16 DIAGNOSIS — M79602 Pain in left arm: Secondary | ICD-10-CM

## 2021-02-16 DIAGNOSIS — M79601 Pain in right arm: Secondary | ICD-10-CM

## 2021-02-16 NOTE — Patient Instructions (Signed)
Shoulder Range of Motion Exercises Shoulder range of motion (ROM) exercises are done to keep the shoulder moving freely or to increase movement. They are often recommended for people who have shoulder pain or stiffness or who are recovering from a shoulder surgery. Phase 1 exercises When you are able, do this exercise 1-2 times per day for 30-60 seconds in each direction, or as directed by your health care provider. Pendulum exercise To do this exercise while sitting: 1. Sit in a chair or at the edge of your bed with your feet flat on the floor. 2. Let your affected arm hang down in front of you over the edge of the bed or chair. 3. Relax your shoulder, arm, and hand. 4. Rock your body so your arm gently swings in small circles. You can also use your unaffected arm to start the motion. 5. Repeat changing the direction of the circles, swinging your arm left and right, and swinging your arm forward and back. To do this exercise while standing: 1. Stand next to a sturdy chair or table, and hold on to it with your hand on your unaffected side. 2. Bend forward at the waist. 3. Bend your knees slightly. 4. Relax your shoulder, arm, and hand. 5. While keeping your shoulder relaxed, use body motion to swing your arm in small circles. 6. Repeat changing the direction of the circles, swinging your arm left and right, and swinging your arm forward and back. 7. Between exercises, stand up tall and take a short break to relax your lower back.   Phase 2 exercises Do these exercises 1-2 times per day or as told by your health care provider. Hold each stretch for 30 seconds, and repeat 3 times. Do the exercises with one or both arms as instructed by your health care provider. For these exercises, sit at a table with your hand and arm supported by the table. A chair that slides easily or has wheels can be helpful. External rotation 1. Turn your chair so that your affected side is nearest to the  table. 2. Place your forearm on the table to your side. Bend your elbow about 90 at the elbow (right angle) and place your hand palm facing down on the table. Your elbow should be about 6 inches away from your side. 3. Keeping your arm on the table, lean your body forward. Abduction 1. Turn your chair so that your affected side is nearest to the table. 2. Place your forearm and hand on the table so that your thumb points toward the ceiling and your arm is straight out to your side. 3. Slide your hand out to the side and away from you, using your unaffected arm to do the work. 4. To increase the stretch, you can slide your chair away from the table. Flexion: forward stretch 1. Sit facing the table. Place your hand and elbow on the table in front of you. 2. Slide your hand forward and away from you, using your unaffected arm to do the work. 3. To increase the stretch, you can slide your chair backward. Phase 3 exercises Do these exercises 1-2 times per day or as told by your health care provider. Hold each stretch for 30 seconds, and repeat 3 times. Do the exercises with one or both arms as instructed by your health care provider. Cross-body stretch: posterior capsule stretch 1. Lift your arm straight out in front of you. 2. Bend your arm 90 at the elbow (right angle) so your   forearm moves across your body. 3. Use your other arm to gently pull the elbow across your body, toward your other shoulder. Wall climbs 1. Stand with your affected arm extended out to the side with your hand resting on a door frame. 2. Slide your hand slowly up the door frame. 3. To increase the stretch, step through the door frame. Keep your body upright and do not lean. Wand exercises You will need a cane, a piece of PVC pipe, or a sturdy wooden dowel for wand exercises. Flexion To do this exercise while standing: 1. Hold the wand with both of your hands, palms down. 2. Using the other arm to help, lift your arms  up and over your head, if able. 3. Push upward with your other arm to gently increase the stretch. To do this exercise while lying down: 1. Lie on your back with your elbows resting on the floor and the wand in both your hands. Your hands will be palm down, or pointing toward your feet. 2. Lift your hands toward the ceiling, using your unaffected arm to help if needed. 3. Bring your arms overhead as able, using your unaffected arm to help if needed. Internal rotation 1. Stand while holding the wand behind you with both hands. Your unaffected arm should be extended above your head with the arm of the affected side extended behind you at the level of your waist. The wand should be pointing straight up and down as you hold it. 2. Slowly pull the wand up behind your back by straightening the elbow of your unaffected arm and bending the elbow of your affected arm. External rotation 1. Lie on your back with your affected upper arm supported on a small pillow or rolled towel. When you first do this exercise, keep your upper arm close to your body. Over time, bring your arm up to a 90 angle out to the side. 2. Hold the wand across your stomach and with both hands palm up. Your elbow on your affected side should be bent at a 90 angle. 3. Use your unaffected side to help push your forearm away from you and toward the floor. Keep your elbow on your affected side bent at a 90 angle. Contact a health care provider if you have:  New or increasing pain.  New numbness, tingling, weakness, or discoloration in your arm or hand. This information is not intended to replace advice given to you by your health care provider. Make sure you discuss any questions you have with your health care provider. Document Revised: 01/03/2018 Document Reviewed: 01/03/2018 Elsevier Patient Education  2021 [employer] Inc[employer]  

## 2021-02-16 NOTE — Progress Notes (Signed)
Established Patient Office Visit  Subjective:  Patient ID: Theresa Pacheco, female    DOB: 08/08/1971  Age: 50 y.o. MRN: [id number]  CC:  Chief Complaint  Patient presents with     . Hypothyroidism  . Hyperlipidemia    HPI Theresa Pacheco presents for follow up hypothyrodism - she is currently taking synthroid qd - voices no problems or concerns besides having some issues with weight- is due for labwork  Pt takes vit D 50000IU weekly - is due for labwork  Pt with history of hyperlipidemia - she is taking crestor 10mg  qd - due for labwork Is trying to watch diet  Pt states she would like to be referred for a sleep study - says she snores bad/loud at night - feels rested most of time and unsure if stopping breathing - is waking up some at night  Pt complains for the past year she has had some tightness and soreness in both upper arms and shoulders especially to reach behind her body - denies any injury or trauma  Past Medical History:  Diagnosis Date  . Chronic fatigue    no current problem  . Dizziness    no current problems  . Hashimoto's disease   . Headache    no current problem  . History of kidney stones    surgery to remove  . Hyperlipemia     Past Surgical History:  Procedure Laterality Date  . DILATION AND CURETTAGE OF UTERUS    . LITHOTRIPSY    . RHINOPLASTY  1986    Family History  Problem Relation Age of Onset  . Heart disease Father   . Hypertension Father   . Breast cancer Maternal Aunt   . Alzheimer's disease Maternal Aunt   . Hypertension Mother   . Hyperlipidemia Mother   . Memory loss Mother   . Breast cancer Maternal Grandmother   . Hodgkin's lymphoma Paternal Aunt   . Alzheimer's disease Maternal Aunt   . Colon cancer Neg Hx   . Rectal cancer Neg Hx   . Stomach cancer Neg Hx   . Esophageal cancer Neg Hx     Social History   Socioeconomic History  . Marital status: Married    Spouse name: Not on file  . Number of  children: 1  . Years of education: college  . Highest education level: Bachelor's degree (e.g., BA, AB, BS)  Occupational History  . Occupation:  [employer]  Tobacco Use  . Smoking status: Never Smoker  . Smokeless tobacco: Never Used  Vaping Use  . Vaping Use: Never used  Substance and Sexual Activity  . Alcohol use: Yes    Alcohol/week: 1.0 - 2.0 standard drink    Types: 1 - 2 Standard drinks or equivalent per week    Comment: beer/wine ocassionally  . Drug use: No  . Sexual activity: Yes    Partners: Male    Birth control/protection: None  Other Topics Concern  . Not on file  Social History Narrative   Lives at home with husband and son.   Right-handed.   2 cups caffeine per day.   Social Determinants of Health   Financial Resource Strain: Not on file  Food Insecurity: Not on file  Transportation Needs: Not on file  Physical Activity: Not on file  Stress: Not on file  Social Connections: Not on file  Intimate Partner Violence: Not on file     Current Outpatient Medications:  .  rosuvastatin (CRESTOR) 10 MG tablet, TAKE ONE TABLET BY MOUTH EVERY DAY, Disp: 90 tablet, Rfl: 1 .  SYNTHROID 88 MCG tablet, TAKE 1 TABLET BY MOUTH ONCE DAILY, Disp: 90 tablet, Rfl: 1 .  Vitamin D, Ergocalciferol, (DRISDOL) 1.25 MG (50000 UNIT) CAPS capsule, Take 1 capsule (50,000 Units total) by mouth every 7 (seven) days., Disp: 12 capsule, Rfl: 1   No Known Allergies  ROS CONSTITUTIONAL: Negative for chills, fatigue, fever, unintentional weight gain and unintentional weight loss.  E/N/T: Negative for ear pain, nasal congestion and sore throat.  CARDIOVASCULAR: Negative for chest pain, dizziness, palpitations and pedal edema.  RESPIRATORY: Negative for recent cough and dyspnea.  GASTROINTESTINAL: Negative for abdominal pain, acid reflux symptoms, constipation, diarrhea, nausea and vomiting.  MSK: see HPI INTEGUMENTARY: Negative for rash.   PSYCHIATRIC: Negative for sleep  disturbance and to question depression screen.  Negative for depression, negative for anhedonia.         Objective:    PHYSICAL EXAM:   VS: BP 106/70   Pulse 68   Temp (!) 97.5 F (36.4 C)   Resp 16   Ht 5' 4.5" (1.638 m)   Wt 197 lb (89.4 kg)   SpO2 98%   BMI 33.29 kg/m   PHYSICAL EXAM:   VS: BP 106/70   Pulse 68   Temp (!) 97.5 F (36.4 C)   Resp 16   Ht 5' 4.5" (1.638 m)   Wt 197 lb (89.4 kg)   SpO2 98%   BMI 33.29 kg/m   GEN: Well nourished, well developed, in no acute distress  Cardiac: RRR; no murmurs, rubs, or gallops,no edema -  Respiratory:  normal respiratory rate and pattern with no distress - normal breath sounds with no rales, rhonchi, wheezes or rubs GI: normal bowel sounds, no masses or tenderness MS: no deformity or atrophy  Skin: warm and dry, no rash  Neuro:  Alert and Oriented x 3, Strength and sensation are intact - CN II-Xii grossly intact Psych: euthymic mood, appropriate affect and demeanor   BP 106/70   Pulse 68   Temp (!) 97.5 F (36.4 C)   Resp 16   Ht 5' 4.5" (1.638 m)   Wt 197 lb (89.4 kg)   SpO2 98%   BMI 33.29 kg/m  Wt Readings from Last 3 Encounters:  02/16/21 197 lb (89.4 kg)  08/13/20 181 lb (82.1 kg)  08/04/20 180 lb 3.2 oz (81.7 kg)     There are no preventive care reminders to display for this patient.  There are no preventive care reminders to display for this patient.  Lab Results  Component Value Date   TSH 3.630 08/13/2020   Lab Results  Component Value Date   WBC 10.2 08/13/2020   HGB 14.1 08/13/2020   HCT 42.5 08/13/2020   MCV 92 08/13/2020   PLT 378 08/13/2020   Lab Results  Component Value Date   NA 142 08/13/2020   K 4.4 08/13/2020   CO2 25 08/13/2020   GLUCOSE 86 08/13/2020   BUN 24 08/13/2020   CREATININE 0.76 08/13/2020   BILITOT 0.4 08/13/2020   ALKPHOS 63 08/13/2020   AST 14 08/13/2020   ALT 16 08/13/2020   PROT 7.5 08/13/2020   ALBUMIN 4.9 (H) 08/13/2020   CALCIUM 9.8  08/13/2020   GFR 118.64 02/06/2012   Lab Results  Component Value Date   CHOL 185 08/13/2020   Lab Results  Component Value Date   HDL 52 08/13/2020  Lab Results  Component Value Date   LDLCALC 115 (H) 08/13/2020   Lab Results  Component Value Date   TRIG 102 08/13/2020   Lab Results  Component Value Date   CHOLHDL 3.6 08/13/2020   No results found for: HGBA1C    Assessment & Plan:   Problem List Items Addressed This Visit      Endocrine   Other specified hypothyroidism - Primary   Relevant Orders   TSH     Other   Mixed hyperlipidemia   Relevant Orders   CBC with Differential/Platelet   Comprehensive metabolic panel   Lipid panel   Vitamin D insufficiency   Relevant Orders   VITAMIN D 25 Hydroxy (Vit-D Deficiency, Fractures)   Loud snoring   Relevant Orders   Ambulatory referral to Sleep Studies   Bilateral arm pain otc motrin as needed rom exercises given   Need for tetanus, diphtheria, and acellular pertussis (Tdap) vaccine   Relevant Orders   Tdap vaccine greater than or equal to 7yo IM      No orders of the defined types were placed in this encounter.   Follow-up: Return in about 6 months (around 08/19/2021) for chronic fasting follow up.    SARA R Salley Boxley, PA-C

## 2021-02-17 ENCOUNTER — Other Ambulatory Visit: Payer: Self-pay | Admitting: Physician Assistant

## 2021-02-17 DIAGNOSIS — E038 Other specified hypothyroidism: Secondary | ICD-10-CM

## 2021-02-17 LAB — CBC WITH DIFFERENTIAL/PLATELET
Basophils Absolute: 0.1 10*3/uL (ref 0.0–0.2)
Basos: 1 %
EOS (ABSOLUTE): 0.2 10*3/uL (ref 0.0–0.4)
Eos: 2 %
Hematocrit: 42 % (ref 34.0–46.6)
Hemoglobin: 14.2 g/dL (ref 11.1–15.9)
Immature Grans (Abs): 0 10*3/uL (ref 0.0–0.1)
Immature Granulocytes: 0 %
Lymphocytes Absolute: 2.8 10*3/uL (ref 0.7–3.1)
Lymphs: 32 %
MCH: 30.1 pg (ref 26.6–33.0)
MCHC: 33.8 g/dL (ref 31.5–35.7)
MCV: 89 fL (ref 79–97)
Monocytes Absolute: 0.4 10*3/uL (ref 0.1–0.9)
Monocytes: 5 %
Neutrophils Absolute: 5.3 10*3/uL (ref 1.4–7.0)
Neutrophils: 60 %
Platelets: 382 10*3/uL (ref 150–450)
RBC: 4.71 x10E6/uL (ref 3.77–5.28)
RDW: 12.7 % (ref 11.7–15.4)
WBC: 8.7 10*3/uL (ref 3.4–10.8)

## 2021-02-17 LAB — COMPREHENSIVE METABOLIC PANEL
ALT: 17 IU/L (ref 0–32)
AST: 20 IU/L (ref 0–40)
Albumin/Globulin Ratio: 1.8 (ref 1.2–2.2)
Albumin: 4.6 g/dL (ref 3.8–4.9)
Alkaline Phosphatase: 61 IU/L (ref 44–121)
BUN/Creatinine Ratio: 23 (ref 9–23)
BUN: 17 mg/dL (ref 6–24)
Bilirubin Total: 0.3 mg/dL (ref 0.0–1.2)
CO2: 23 mmol/L (ref 20–29)
Calcium: 9.6 mg/dL (ref 8.7–10.2)
Chloride: 103 mmol/L (ref 96–106)
Creatinine, Ser: 0.75 mg/dL (ref 0.57–1.00)
Globulin, Total: 2.6 g/dL (ref 1.5–4.5)
Glucose: 93 mg/dL (ref 65–99)
Potassium: 4.7 mmol/L (ref 3.5–5.2)
Sodium: 143 mmol/L (ref 134–144)
Total Protein: 7.2 g/dL (ref 6.0–8.5)
eGFR: 96 mL/min/{1.73_m2} (ref >59)

## 2021-02-17 LAB — LIPID PANEL
Chol/HDL Ratio: 3.4 ratio (ref 0.0–4.4)
Cholesterol, Total: 184 mg/dL (ref 100–199)
HDL: 54 mg/dL (ref >39)
LDL Chol Calc (NIH): 112 mg/dL — ABNORMAL HIGH (ref 0–99)
Triglycerides: 99 mg/dL (ref 0–149)
VLDL Cholesterol Cal: 18 mg/dL (ref 5–40)

## 2021-02-17 LAB — CARDIOVASCULAR RISK ASSESSMENT

## 2021-02-17 LAB — VITAMIN D 25 HYDROXY (VIT D DEFICIENCY, FRACTURES): Vit D, 25-Hydroxy: 28.7 ng/mL — ABNORMAL LOW (ref 30.0–100.0)

## 2021-02-17 LAB — TSH: TSH: 5.98 u[IU]/mL — ABNORMAL HIGH (ref 0.450–4.500)

## 2021-02-17 MED ORDER — ROSUVASTATIN CALCIUM 10 MG PO TABS: 10.0000 mg | ORAL_TABLET | Freq: Every day | ORAL | 1 refills | Status: DC

## 2021-02-17 MED ORDER — LEVOTHYROXINE SODIUM 100 MCG PO TABS: 100.0000 ug | ORAL_TABLET | Freq: Every day | ORAL | 0 refills | Status: DC

## 2021-02-17 MED ORDER — VITAMIN D (ERGOCALCIFEROL) 1.25 MG (50000 UNIT) PO CAPS: 50000.0000 [IU] | ORAL_CAPSULE | ORAL | 1 refills | Status: DC

## 2021-04-19 ENCOUNTER — Other Ambulatory Visit: Payer: Self-pay

## 2021-04-19 ENCOUNTER — Other Ambulatory Visit: Payer: BC Managed Care – PPO

## 2021-04-19 DIAGNOSIS — E038 Other specified hypothyroidism: Secondary | ICD-10-CM

## 2021-04-20 ENCOUNTER — Other Ambulatory Visit: Payer: Self-pay

## 2021-04-20 LAB — TSH: TSH: 2.34 u[IU]/mL (ref 0.450–4.500)

## 2021-04-20 MED ORDER — LEVOTHYROXINE SODIUM 100 MCG PO TABS: 100.0000 ug | ORAL_TABLET | Freq: Every day | ORAL | 0 refills | Status: DC

## 2021-05-27 ENCOUNTER — Encounter: Payer: Self-pay | Admitting: Nurse Practitioner

## 2021-05-27 ENCOUNTER — Ambulatory Visit: Payer: BC Managed Care – PPO | Admitting: Nurse Practitioner

## 2021-05-27 ENCOUNTER — Other Ambulatory Visit: Payer: Self-pay | Admitting: Physician Assistant

## 2021-05-27 VITALS — BP 128/90 | HR 88 | Temp 97.7°F | Ht 64.5 in | Wt 199.0 lb

## 2021-05-27 DIAGNOSIS — K5730 Diverticulosis of large intestine without perforation or abscess without bleeding: Secondary | ICD-10-CM | POA: Diagnosis not present

## 2021-05-27 DIAGNOSIS — R400 Somnolence: Secondary | ICD-10-CM | POA: Insufficient documentation

## 2021-05-27 DIAGNOSIS — R1084 Generalized abdominal pain: Secondary | ICD-10-CM

## 2021-05-27 DIAGNOSIS — R509 Fever, unspecified: Secondary | ICD-10-CM | POA: Diagnosis not present

## 2021-05-27 DIAGNOSIS — Z8719 Personal history of other diseases of the digestive system: Secondary | ICD-10-CM

## 2021-05-27 DIAGNOSIS — Z87442 Personal history of urinary calculi: Secondary | ICD-10-CM

## 2021-05-27 DIAGNOSIS — G4733 Obstructive sleep apnea (adult) (pediatric): Secondary | ICD-10-CM | POA: Insufficient documentation

## 2021-05-27 DIAGNOSIS — R1033 Periumbilical pain: Secondary | ICD-10-CM | POA: Diagnosis not present

## 2021-05-27 LAB — POCT URINALYSIS DIP (CLINITEK)
Blood, UA: NEGATIVE
Glucose, UA: NEGATIVE mg/dL
Nitrite, UA: NEGATIVE
Spec Grav, UA: >=1.03 — AB (ref 1.010–1.025)
Urobilinogen, UA: NEGATIVE E.U./dL — AB
pH, UA: 6 (ref 5.0–8.0)

## 2021-05-27 LAB — POC COVID19 BINAXNOW: SARS Coronavirus 2 Ag: NEGATIVE

## 2021-05-27 LAB — POCT RAPID STREP A (OFFICE): Rapid Strep A Screen: POSITIVE — AB

## 2021-05-27 LAB — POCT URINE PREGNANCY: Preg Test, Ur: NEGATIVE

## 2021-05-27 NOTE — Progress Notes (Addendum)
Acute Office Visit  Subjective:    Patient ID: Theresa Pacheco, female    DOB: 06/11/1971, 50 y.o.   MRN: [id number]  CC: Fever and abdominal pain   HPI Theresa Pacheco is a 50 year old Caucasian female that presents with fever and abdominal pain.   Abdominal Pain  She reports new onset abdominal pain. The most recent episode started a few days ago and is staying constant. The abdominal pain is located in the left lower quadrant and periumbilical area with radiation to right lower quadrant It is described as aching,cramping, and colicky is 4/09 in intensity, occurring constantly. It is aggravated by  standing  and is relieved by nothing. States pain is less when sitting. She has tried  laxatives with little relief. She denies nausea or vomiting. Had mucousy loose stools and constipation. She underwent screening colonoscopy in May 2022 with Dr Tarri Glenn that revealed sigmoid diverticulosis.She has experienced decreased appetite. She is NPO today. UA and urine HCG negative.  Associated symptoms: Yes anorexia  No belching  No bloody stool No blood in urine   Yes constipation Yes diarrhea  No dysuria Yes fever  No flatus No headaches  No headaches No joint pains  Yes myalgias No nausea  No vomiting No weight loss     Recent GI studies:colonoscopy Relevant medical history includes: none  Previous labs Lab Results  Component Value Date   WBC 8.7 02/16/2021   HGB 14.2 02/16/2021   HCT 42.0 02/16/2021   MCV 89 02/16/2021   MCH 30.1 02/16/2021   RDW 12.7 02/16/2021   PLT 382 02/16/2021   Lab Results  Component Value Date   GLUCOSE 93 02/16/2021   NA 143 02/16/2021   K 4.7 02/16/2021   CL 103 02/16/2021   CO2 23 02/16/2021   BUN 17 02/16/2021   CREATININE 0.75 02/16/2021   GFRNONAA 92 08/13/2020   GFRAA 106 08/13/2020   CALCIUM 9.6 02/16/2021   PROT 7.2 02/16/2021   ALBUMIN 4.6 02/16/2021   LABGLOB 2.6 02/16/2021   AGRATIO 1.8 02/16/2021   BILITOT 0.3 02/16/2021   ALKPHOS  61 02/16/2021   AST 20 02/16/2021   ALT 17 02/16/2021     Past Medical History:  Diagnosis Date   Chronic fatigue    no current problem   Dizziness    no current problems   Hashimoto's disease    Headache    no current problem   History of kidney stones    surgery to remove   Hyperlipemia     Past Surgical History:  Procedure Laterality Date   DILATION AND CURETTAGE OF UTERUS     LITHOTRIPSY     RHINOPLASTY  1986    Family History  Problem Relation Age of Onset   Heart disease Father    Hypertension Father    Breast cancer Maternal Aunt    Alzheimer's disease Maternal Aunt    Hypertension Mother    Hyperlipidemia Mother    Memory loss Mother    Breast cancer Maternal Grandmother    Hodgkin's lymphoma Paternal Aunt    Alzheimer's disease Maternal Aunt    Colon cancer Neg Hx    Rectal cancer Neg Hx    Stomach cancer Neg Hx    Esophageal cancer Neg Hx     Social History   Socioeconomic History   Marital status: Married    Spouse name: Not on file   Number of children: 1   Years of education: college   Highest education level:  Bachelor's degree (e.g., BA, AB, BS)  Occupational History   Occupation: Investment banker, operational  [employer]  Tobacco Use   Smoking status: Never   Smokeless tobacco: Never  Vaping Use   Vaping Use: Never used  Substance and Sexual Activity   Alcohol use: Yes    Alcohol/week: 1.0 - 2.0 standard drink    Types: 1 - 2 Standard drinks or equivalent per week    Comment: beer/wine ocassionally   Drug use: No   Sexual activity: Yes    Partners: Male    Birth control/protection: None  Other Topics Concern   Not on file  Social History Narrative   Lives at home with husband and son.   Right-handed.   2 cups caffeine per day.   Social Determinants of Health   Financial Resource Strain: Not on file  Food Insecurity: Not on file  Transportation Needs: Not on file  Physical Activity: Not on file  Stress: Not on file  Social Connections: Not  on file  Intimate Partner Violence: Not on file    Outpatient Medications Prior to Visit  Medication Sig Dispense Refill   levothyroxine (SYNTHROID) 100 MCG tablet Take 1 tablet (100 mcg total) by mouth daily. 90 tablet 0   rosuvastatin (CRESTOR) 10 MG tablet Take 1 tablet (10 mg total) by mouth daily. 90 tablet 1   Vitamin D, Ergocalciferol, (DRISDOL) 1.25 MG (50000 UNIT) CAPS capsule Take 1 capsule (50,000 Units total) by mouth 2 (two) times a week. 25 capsule 1   No facility-administered medications prior to visit.    No Known Allergies  Review of Systems  Constitutional:  Positive for appetite change (decreased), chills, fatigue and fever.  HENT: Negative.    Eyes: Negative.   Respiratory: Negative.    Cardiovascular: Negative.   Gastrointestinal:  Positive for abdominal distention (bloated), abdominal pain, constipation and diarrhea. Negative for anal bleeding, blood in stool, nausea and vomiting.  Genitourinary: Negative.   Musculoskeletal: Negative.   Skin: Negative.   Allergic/Immunologic: Negative.   Neurological: Negative.   Psychiatric/Behavioral: Negative.        Objective:    Physical Exam Vitals reviewed.  Constitutional:      Appearance: Normal appearance.  HENT:     Head: Normocephalic.     Mouth/Throat:     Mouth: Mucous membranes are moist.  Cardiovascular:     Rate and Rhythm: Normal rate and regular rhythm.  Pulmonary:     Effort: Pulmonary effort is normal.     Breath sounds: Normal breath sounds.  Abdominal:     General: Bowel sounds are normal.     Palpations: Abdomen is soft. There is no mass.     Tenderness: There is abdominal tenderness. There is guarding and rebound. There is no right CVA tenderness or left CVA tenderness.     Hernia: No hernia is present.  Skin:    General: Skin is warm and dry.     Capillary Refill: Capillary refill takes less than 2 seconds.  Neurological:     General: No focal deficit present.     Mental Status:  She is alert and oriented to person, place, and time.    Wt Readings from Last 3 Encounters:  02/16/21 197 lb (89.4 kg)  08/13/20 181 lb (82.1 kg)  08/04/20 180 lb 3.2 oz (81.7 kg)    Health Maintenance Due  Topic Date Due   Zoster Vaccines- Shingrix (1 of 2) Never done   COVID-19 Vaccine (4 - Booster for [employer]  series) 04/18/2021    Lab Results  Component Value Date   TSH 2.340 04/19/2021   Lab Results  Component Value Date   WBC 8.7 02/16/2021   HGB 14.2 02/16/2021   HCT 42.0 02/16/2021   MCV 89 02/16/2021   PLT 382 02/16/2021   Lab Results  Component Value Date   NA 143 02/16/2021   K 4.7 02/16/2021   CO2 23 02/16/2021   GLUCOSE 93 02/16/2021   BUN 17 02/16/2021   CREATININE 0.75 02/16/2021   BILITOT 0.3 02/16/2021   ALKPHOS 61 02/16/2021   AST 20 02/16/2021   ALT 17 02/16/2021   PROT 7.2 02/16/2021   ALBUMIN 4.6 02/16/2021   CALCIUM 9.6 02/16/2021   EGFR 96 02/16/2021   GFR 118.64 02/06/2012   Lab Results  Component Value Date   CHOL 184 02/16/2021   Lab Results  Component Value Date   HDL 54 02/16/2021   Lab Results  Component Value Date   LDLCALC 112 (H) 02/16/2021   Lab Results  Component Value Date   TRIG 99 02/16/2021   Lab Results  Component Value Date   CHOLHDL 3.4 02/16/2021       Assessment & Plan:   1. Periumbilical pain - CT Abdomen Pelvis W Contrast  2. Fever, unspecified fever cause - POC COVID-19 - POCT rapid strep A - POCT URINALYSIS DIP (CLINITEK) - CBC with Differential/Platelet - Comprehensive metabolic panel - CT Abdomen Pelvis W Contrast  3. Generalized abdominal pain - POC COVID-19 - POCT rapid strep A - POCT URINALYSIS DIP (CLINITEK) - CBC with Differential/Platelet - Comprehensive metabolic panel - POCT urine pregnancy  4. Sigmoid diverticulosis - CBC with Differential/Platelet - CT Abdomen Pelvis W Contrast  5. History of diverticulosis - CBC with Differential/Platelet - CT Abdomen Pelvis W  Contrast  6. History of nephrolithiasis - Comprehensive metabolic panel - CT Abdomen Pelvis W Contrast   CT shows acute diverticulitis, I called patient and left message, I called in augmentin 864m and flagyl 500 to pharmacy  Follow-up: Pending labs and CT abd results   Signed, SRip Harbour NP

## 2021-05-27 NOTE — Patient Instructions (Signed)
We will call you with abdominal CT appointment   CT Scan  A CT scan is a kind of X-ray. A CT scan makes pictures of the inside of your body. The pictures will be taken in a large machine that has an opening (CT scanner). What happens before the procedure? Staying hydrated Follow instructions from your doctor about hydration, which may include: Up to 2 hours before the procedure - you may continue to drink clear liquids. These include water, clear fruit juice, black coffee, and plain tea. Eating and drinking restrictions Follow instructions from your doctor about eating and drinking, which may include: 24 hours before the procedure - stop drinking caffeinated drinks. These include energy drinks, tea, soda, coffee, and hot chocolate. 8 hours before the procedure - stop eating heavy meals or foods. These include meat, fried foods, or fatty foods. 6 hours before the procedure - stop eating light meals or foods. These include toast or cereal. 6 hours before the procedure - stop drinking milk or drinks that have milk in them. 2 hours before the procedure - stop drinking clear liquids. General instructions Take off any jewelry. Ask your doctor about changing or stopping your normal medicines. This is important if you take diabetes medicines or blood thinners. What happens during the procedure? An IV tube may be put into one of your veins. Dye may be put into the IV tube. You may feel warm or have a metal taste in your mouth. You will lie on a table with your arms above your head. The table you will be lying on will move into the CT scanner. You will be able to see, hear, and talk to the person who is running the machine while you are in it. Follow that person's directions. The machine will move around you to take pictures. Do not move. When the machine is done taking pictures, it will be turned off. The table will be moved out of the machine. Your IV tube will be taken out. The procedure may  vary among doctors and hospitals. What happens after the procedure? It is up to you to get your results. Ask when your results will be ready. Summary A CT scan is a kind of X-ray. A CT scan makes pictures of the inside of your body. Follow instructions from your doctor about eating and drinking before the procedure. You will be able to see, hear, and talk to the person who is running the machine while you are in it. Follow that person's directions. This information is not intended to replace advice given to you by your health care provider. Make sure you discuss any questions you have with your healthcare provider. Document Revised: 10/22/2020 Document Reviewed: 10/22/2020 Elsevier Patient Education  2022 [employer] Abdominal Pain, Adult Many things can cause belly (abdominal) pain. Most times, belly pain is not dangerous. Many cases of belly pain can be watched and treated at home. Sometimes, though, belly pain is serious. Yourdoctor will try to find the cause of your belly pain. Follow these instructions at home:  Medicines Take over-the-counter and prescription medicines only as told by your doctor. Do not take medicines that help you poop (laxatives) unless told by your doctor. General instructions Watch your belly pain for any changes. Drink enough fluid to keep your pee (urine) pale yellow. Keep all follow-up visits as told by your doctor. This is important. Contact a doctor if: Your belly pain changes or gets worse. You are not hungry, or you lose weight  without trying. You are having trouble pooping (constipated) or have watery poop (diarrhea) for more than 2-3 days. You have pain when you pee or poop. Your belly pain wakes you up at night. Your pain gets worse with meals, after eating, or with certain foods. You are vomiting and cannot keep anything down. You have a fever. You have blood in your pee. Get help right away if: Your pain does not go away as soon as your  doctor says it should. You cannot stop vomiting. Your pain is only in areas of your belly, such as the right side or the left lower part of the belly. You have bloody or black poop, or poop that looks like tar. You have very bad pain, cramping, or bloating in your belly. You have signs of not having enough fluid or water in your body (dehydration), such as: Dark pee, very little pee, or no pee. Cracked lips. Dry mouth. Sunken eyes. Sleepiness. Weakness. You have trouble breathing or chest pain. Summary Many cases of belly pain can be watched and treated at home. Watch your belly pain for any changes. Take over-the-counter and prescription medicines only as told by your doctor. Contact a doctor if your belly pain changes or gets worse. Get help right away if you have very bad pain, cramping, or bloating in your belly. This information is not intended to replace advice given to you by your health care provider. Make sure you discuss any questions you have with your healthcare provider. Document Revised: 04/01/2019 Document Reviewed: 04/01/2019 Elsevier Patient Education  2022 [employer].

## 2021-05-28 ENCOUNTER — Encounter: Payer: Self-pay | Admitting: Nurse Practitioner

## 2021-05-28 ENCOUNTER — Other Ambulatory Visit: Payer: Self-pay | Admitting: Legal Medicine

## 2021-05-28 DIAGNOSIS — K5792 Diverticulitis of intestine, part unspecified, without perforation or abscess without bleeding: Secondary | ICD-10-CM

## 2021-05-28 LAB — COMPREHENSIVE METABOLIC PANEL
ALT: 16 IU/L (ref 0–32)
AST: 16 IU/L (ref 0–40)
Albumin/Globulin Ratio: 1.7 (ref 1.2–2.2)
Albumin: 4.7 g/dL (ref 3.8–4.9)
Alkaline Phosphatase: 65 IU/L (ref 44–121)
BUN/Creatinine Ratio: 20 (ref 9–23)
BUN: 14 mg/dL (ref 6–24)
Bilirubin Total: 0.4 mg/dL (ref 0.0–1.2)
CO2: 22 mmol/L (ref 20–29)
Calcium: 9.4 mg/dL (ref 8.7–10.2)
Chloride: 95 mmol/L — ABNORMAL LOW (ref 96–106)
Creatinine, Ser: 0.69 mg/dL (ref 0.57–1.00)
Globulin, Total: 2.7 g/dL (ref 1.5–4.5)
Glucose: 86 mg/dL (ref 65–99)
Potassium: 4.4 mmol/L (ref 3.5–5.2)
Sodium: 138 mmol/L (ref 134–144)
Total Protein: 7.4 g/dL (ref 6.0–8.5)
eGFR: 105 mL/min/{1.73_m2} (ref >59)

## 2021-05-28 LAB — CBC WITH DIFFERENTIAL/PLATELET
Basophils Absolute: 0 10*3/uL (ref 0.0–0.2)
Basos: 0 %
EOS (ABSOLUTE): 0.2 10*3/uL (ref 0.0–0.4)
Eos: 2 %
Hematocrit: 42.2 % (ref 34.0–46.6)
Hemoglobin: 14.3 g/dL (ref 11.1–15.9)
Immature Grans (Abs): 0 10*3/uL (ref 0.0–0.1)
Immature Granulocytes: 0 %
Lymphocytes Absolute: 2.6 10*3/uL (ref 0.7–3.1)
Lymphs: 25 %
MCH: 30 pg (ref 26.6–33.0)
MCHC: 33.9 g/dL (ref 31.5–35.7)
MCV: 89 fL (ref 79–97)
Monocytes Absolute: 0.6 10*3/uL (ref 0.1–0.9)
Monocytes: 5 %
Neutrophils Absolute: 7.2 10*3/uL — ABNORMAL HIGH (ref 1.4–7.0)
Neutrophils: 68 %
Platelets: 414 10*3/uL (ref 150–450)
RBC: 4.77 x10E6/uL (ref 3.77–5.28)
RDW: 12.5 % (ref 11.7–15.4)
WBC: 10.6 10*3/uL (ref 3.4–10.8)

## 2021-05-28 MED ORDER — AMOXICILLIN-POT CLAVULANATE 875-125 MG PO TABS: 1.0000 | ORAL_TABLET | Freq: Two times a day (BID) | ORAL | 0 refills | Status: DC

## 2021-05-28 MED ORDER — METRONIDAZOLE 500 MG PO TABS: 500.0000 mg | ORAL_TABLET | Freq: Three times a day (TID) | ORAL | 0 refills | Status: AC

## 2021-05-31 ENCOUNTER — Other Ambulatory Visit: Payer: Self-pay | Admitting: Nurse Practitioner

## 2021-05-31 ENCOUNTER — Telehealth: Payer: Self-pay

## 2021-05-31 DIAGNOSIS — K5792 Diverticulitis of intestine, part unspecified, without perforation or abscess without bleeding: Secondary | ICD-10-CM

## 2021-05-31 MED ORDER — CIPROFLOXACIN HCL 500 MG PO TABS: 500.0000 mg | ORAL_TABLET | Freq: Two times a day (BID) | ORAL | 0 refills | Status: AC

## 2021-05-31 NOTE — Telephone Encounter (Signed)
Patient contacted to follow up on treatment over the weekend for her diverticulitis, and patient was having a hard time with the two antibiotics causing watery diarrhea to the point where the patient can't leave the house. She was wanting to know if there is anything we can do for that? Per Flonnie Hailstone, DNP patient's provider, she stated she would change the Amoxicillin to the CIPRO and to stop the amoxicillin and start the cipro to be easier on the stomach. Patient was also reminded about including a high fiber diet and do be tryin to acquire at least 64oz of water daily to stay hydrated. Patient was also recommended to after the trial of a few days of the change in antibiotics to only try the imodium one time if it is still not getting any better. Patient understood verbally and new antibiotic sent. LA

## 2021-08-18 ENCOUNTER — Ambulatory Visit: Payer: BC Managed Care – PPO | Admitting: Physician Assistant

## 2021-08-18 ENCOUNTER — Encounter: Payer: Self-pay | Admitting: Physician Assistant

## 2021-08-18 VITALS — BP 126/84 | HR 79 | Temp 96.0°F | Ht 64.5 in | Wt 206.0 lb

## 2021-08-18 DIAGNOSIS — J029 Acute pharyngitis, unspecified: Secondary | ICD-10-CM | POA: Diagnosis not present

## 2021-08-18 LAB — POCT RAPID STREP A (OFFICE): Rapid Strep A Screen: NEGATIVE

## 2021-08-18 LAB — POC COVID19 BINAXNOW: SARS Coronavirus 2 Ag: NEGATIVE

## 2021-08-18 MED ORDER — CEFDINIR 300 MG PO CAPS: 300.0000 mg | ORAL_CAPSULE | Freq: Two times a day (BID) | ORAL | 0 refills | Status: DC

## 2021-08-18 NOTE — Progress Notes (Signed)
Acute Office Visit  Subjective:    Patient ID: Theresa Pacheco, female    DOB: 29-Jul-1971, 50 y.o.   MRN: [id number]  Chief Complaint  Patient presents with   Sore Throat   Headache    HPI Patient is in today for complaints of sore throat and headache since yesterday -denies fever, malaise or cough Has had some congestion  Past Medical History:  Diagnosis Date   Chronic fatigue    no current problem   Dizziness    no current problems   Hashimoto's disease    Headache    no current problem   History of kidney stones    surgery to remove   Hyperlipemia     Past Surgical History:  Procedure Laterality Date   DILATION AND CURETTAGE OF UTERUS     LITHOTRIPSY     RHINOPLASTY  1986    Family History  Problem Relation Age of Onset   Heart disease Father    Hypertension Father    Breast cancer Maternal Aunt    Alzheimer's disease Maternal Aunt    Hypertension Mother    Hyperlipidemia Mother    Memory loss Mother    Breast cancer Maternal Grandmother    Hodgkin's lymphoma Paternal Aunt    Alzheimer's disease Maternal Aunt    Colon cancer Neg Hx    Rectal cancer Neg Hx    Stomach cancer Neg Hx    Esophageal cancer Neg Hx     Social History   Socioeconomic History   Marital status: Married    Spouse name: Not on file   Number of children: 1   Years of education: college   Highest education level: Bachelor's degree (e.g., BA, AB, BS)  Occupational History   Occupation: Investment banker, operational  [employer]  Tobacco Use   Smoking status: Never   Smokeless tobacco: Never  Vaping Use   Vaping Use: Never used  Substance and Sexual Activity   Alcohol use: Yes    Alcohol/week: 1.0 - 2.0 standard drink    Types: 1 - 2 Standard drinks or equivalent per week    Comment: beer/wine ocassionally   Drug use: No   Sexual activity: Yes    Partners: Male    Birth control/protection: None  Other Topics Concern   Not on file  Social History Narrative   Lives at home with  husband and son.   Right-handed.   2 cups caffeine per day.   Social Determinants of Health   Financial Resource Strain: Not on file  Food Insecurity: Not on file  Transportation Needs: Not on file  Physical Activity: Not on file  Stress: Not on file  Social Connections: Not on file  Intimate Partner Violence: Not on file    Outpatient Medications Prior to Visit  Medication Sig Dispense Refill   rosuvastatin (CRESTOR) 10 MG tablet Take 1 tablet (10 mg total) by mouth daily. 90 tablet 1   SYNTHROID 100 MCG tablet TAKE ONE TABLET BY MOUTH EVERY DAY 90 tablet 0   Vitamin D, Ergocalciferol, (DRISDOL) 1.25 MG (50000 UNIT) CAPS capsule Take 1 capsule (50,000 Units total) by mouth 2 (two) times a week. 25 capsule 1   amoxicillin-clavulanate (AUGMENTIN) 875-125 MG tablet Take 1 tablet by mouth 2 (two) times daily. 20 tablet 0   No facility-administered medications prior to visit.    No Known Allergies  Review of Systems CONSTITUTIONAL: Negative for chills, fatigue, fever, unintentional weight gain and unintentional weight loss.  E/N/T:see HPI  CARDIOVASCULAR: Negative for chest pain, dizziness, palpitations and pedal edema.  RESPIRATORY: Negative for recent cough and dyspnea.  GASTROINTESTINAL: Negative for abdominal pain, acid reflux symptoms, constipation, diarrhea, nausea and vomiting.          Objective:   PHYSICAL EXAM:   VS: BP 126/84 (BP Location: Left Arm, Patient Position: Sitting, Cuff Size: Large)   Pulse 79   Temp (!) 96 F (35.6 C) (Temporal)   Ht 5' 4.5" (1.638 m)   Wt 206 lb (93.4 kg)   SpO2 96%   BMI 34.81 kg/m   GEN: Well nourished, well developed, in no acute distress  Oropharynx - erythema Cardiac: RRR; no murmurs, rubs, or gallops,no edema -  Respiratory:  normal respiratory rate and pattern with no distress - normal breath sounds with no rales, rhonchi, wheezes or rubs  Office Visit on 08/18/2021  Component Date Value Ref Range Status   SARS  Coronavirus 2 Ag 08/18/2021 Negative  Negative Final   Rapid Strep A Screen 08/18/2021 Negative  Negative Final     Health Maintenance Due  Topic Date Due   Zoster Vaccines- Shingrix (1 of 2) Never done   COVID-19 Vaccine (4 - Booster for [employer] series) 04/18/2021   INFLUENZA VACCINE  07/05/2021    There are no preventive care reminders to display for this patient.   Lab Results  Component Value Date   TSH 2.340 04/19/2021   Lab Results  Component Value Date   WBC 10.6 05/27/2021   HGB 14.3 05/27/2021   HCT 42.2 05/27/2021   MCV 89 05/27/2021   PLT 414 05/27/2021   Lab Results  Component Value Date   NA 138 05/27/2021   K 4.4 05/27/2021   CO2 22 05/27/2021   GLUCOSE 86 05/27/2021   BUN 14 05/27/2021   CREATININE 0.69 05/27/2021   BILITOT 0.4 05/27/2021   ALKPHOS 65 05/27/2021   AST 16 05/27/2021   ALT 16 05/27/2021   PROT 7.4 05/27/2021   ALBUMIN 4.7 05/27/2021   CALCIUM 9.4 05/27/2021   EGFR 105 05/27/2021   GFR 118.64 02/06/2012   Lab Results  Component Value Date   CHOL 184 02/16/2021   Lab Results  Component Value Date   HDL 54 02/16/2021   Lab Results  Component Value Date   LDLCALC 112 (H) 02/16/2021   Lab Results  Component Value Date   TRIG 99 02/16/2021   Lab Results  Component Value Date   CHOLHDL 3.4 02/16/2021   No results found for: HGBA1C     Assessment & Plan:  1. Pharyngitis, unspecified etiology - POC COVID-19 BinaxNow - POCT rapid strep A - cefdinir (OMNICEF) 300 MG capsule; Take 1 capsule (300 mg total) by mouth 2 (two) times daily.  Dispense: 20 capsule; Refill: 0    Meds ordered this encounter  Medications   cefdinir (OMNICEF) 300 MG capsule    Sig: Take 1 capsule (300 mg total) by mouth 2 (two) times daily.    Dispense:  20 capsule    Refill:  0    Order Specific Question:   Supervising Provider    AnswerShelton Silvas    Orders Placed This Encounter  Procedures   POC COVID-19 BinaxNow   POCT  rapid strep A     Follow-up: Return if symptoms worsen or fail to improve.  An After Visit Summary was printed and given to the patient.  Yetta Flock [facility] [phone]

## 2021-08-21 ENCOUNTER — Encounter: Payer: Self-pay | Admitting: Physician Assistant

## 2021-08-25 ENCOUNTER — Other Ambulatory Visit: Payer: Self-pay | Admitting: Physician Assistant

## 2021-08-25 DIAGNOSIS — Z1231 Encounter for screening mammogram for malignant neoplasm of breast: Secondary | ICD-10-CM

## 2021-08-30 ENCOUNTER — Other Ambulatory Visit: Payer: Self-pay

## 2021-08-30 ENCOUNTER — Encounter: Payer: Self-pay | Admitting: Physician Assistant

## 2021-08-30 ENCOUNTER — Ambulatory Visit: Payer: BC Managed Care – PPO | Admitting: Physician Assistant

## 2021-08-30 VITALS — BP 118/80 | HR 73 | Temp 95.9°F | Ht 66.0 in | Wt 206.0 lb

## 2021-08-30 DIAGNOSIS — E038 Other specified hypothyroidism: Secondary | ICD-10-CM

## 2021-08-30 DIAGNOSIS — E559 Vitamin D deficiency, unspecified: Secondary | ICD-10-CM

## 2021-08-30 DIAGNOSIS — E782 Mixed hyperlipidemia: Secondary | ICD-10-CM | POA: Diagnosis not present

## 2021-08-30 NOTE — Progress Notes (Signed)
Established Patient Office Visit  Subjective:  Patient ID: Theresa Pacheco, female    DOB: Nov 08, 1971  Age: 50 y.o. MRN: [id number]  CC:  Chief Complaint  Patient presents with      Hypothyroidism   Hyperlipidemia    HPI Theresa Pacheco presents for follow up hypothyrodism - she is currently taking synthroid 100 mcg qd - voices no problems or concerns besides having some issues with weight- is due for labwork  Pt takes vit D 50000IU weekly - is due for labwork  Pt with history of hyperlipidemia - she is taking crestor 31m qd - due for labwork Is trying to watch diet   Past Medical History:  Diagnosis Date   Chronic fatigue    no current problem   Dizziness    no current problems   Hashimoto's disease    Headache    no current problem   History of kidney stones    surgery to remove   Hyperlipemia     Past Surgical History:  Procedure Laterality Date   DILATION AND CURETTAGE OF UTERUS     LITHOTRIPSY     RHINOPLASTY  1986    Family History  Problem Relation Age of Onset   Heart disease Father    Hypertension Father    Breast cancer Maternal Aunt    Alzheimer's disease Maternal Aunt    Hypertension Mother    Hyperlipidemia Mother    Memory loss Mother    Breast cancer Maternal Grandmother    Hodgkin's lymphoma Paternal Aunt    Alzheimer's disease Maternal Aunt    Colon cancer Neg Hx    Rectal cancer Neg Hx    Stomach cancer Neg Hx    Esophageal cancer Neg Hx     Social History   Socioeconomic History   Marital status: Married    Spouse name: Not on file   Number of children: 1   Years of education: college   Highest education level: Bachelor's degree (e.g., BA, AB, BS)  Occupational History   Occupation: pInvestment banker, operational [employer]  Tobacco Use   Smoking status: Never   Smokeless tobacco: Never  Vaping Use   Vaping Use: Never used  Substance and Sexual Activity   Alcohol use: Yes    Alcohol/week: 1.0 - 2.0 standard drink    Types: 1  - 2 Standard drinks or equivalent per week    Comment: beer/wine ocassionally   Drug use: No   Sexual activity: Yes    Partners: Male    Birth control/protection: None  Other Topics Concern   Not on file  Social History Narrative   Lives at home with husband and son.   Right-handed.   2 cups caffeine per day.   Social Determinants of Health   Financial Resource Strain: Not on file  Food Insecurity: Not on file  Transportation Needs: Not on file  Physical Activity: Not on file  Stress: Not on file  Social Connections: Not on file  Intimate Partner Violence: Not on file     Current Outpatient Medications:    rosuvastatin (CRESTOR) 10 MG tablet, Take 1 tablet (10 mg total) by mouth daily., Disp: 90 tablet, Rfl: 1   SYNTHROID 100 MCG tablet, TAKE ONE TABLET BY MOUTH EVERY DAY, Disp: 90 tablet, Rfl: 0   Vitamin D, Ergocalciferol, (DRISDOL) 1.25 MG (50000 UNIT) CAPS capsule, Take 1 capsule (50,000 Units total) by mouth 2 (two) times a week., Disp: 25 capsule, Rfl: 1   No  Known Allergies  CONSTITUTIONAL: Negative for chills, fatigue, fever, unintentional weight gain and unintentional weight loss.  E/N/T: Negative for ear pain, nasal congestion and sore throat.  CARDIOVASCULAR: Negative for chest pain, dizziness, palpitations and pedal edema.  RESPIRATORY: Negative for recent cough and dyspnea.  GASTROINTESTINAL: hsa had diarrhea from recent antibiotics       Objective:  PHYSICAL EXAM:   VS: BP 118/80   Pulse 73   Temp (!) 95.9 F (35.5 C)   Ht _0  (1.676 m)   Wt 206 lb (93.4 kg)   SpO2 95%   BMI 33.25 kg/m   GEN: Well nourished, well developed, in no acute distress  Cardiac: RRR; no murmurs, rubs, or gallops,no edema - Respiratory:  normal respiratory rate and pattern with no distress - normal breath sounds with no rales, rhonchi, wheezes or rubs Skin: warm and dry, no rash  Psych: euthymic mood, appropriate affect and demeanor   There are no preventive care  reminders to display for this patient.   There are no preventive care reminders to display for this patient.  Lab Results  Component Value Date   TSH 2.340 04/19/2021   Lab Results  Component Value Date   WBC 10.6 05/27/2021   HGB 14.3 05/27/2021   HCT 42.2 05/27/2021   MCV 89 05/27/2021   PLT 414 05/27/2021   Lab Results  Component Value Date   NA 138 05/27/2021   K 4.4 05/27/2021   CO2 22 05/27/2021   GLUCOSE 86 05/27/2021   BUN 14 05/27/2021   CREATININE 0.69 05/27/2021   BILITOT 0.4 05/27/2021   ALKPHOS 65 05/27/2021   AST 16 05/27/2021   ALT 16 05/27/2021   PROT 7.4 05/27/2021   ALBUMIN 4.7 05/27/2021   CALCIUM 9.4 05/27/2021   EGFR 105 05/27/2021   GFR 118.64 02/06/2012   Lab Results  Component Value Date   CHOL 184 02/16/2021   Lab Results  Component Value Date   HDL 54 02/16/2021   Lab Results  Component Value Date   LDLCALC 112 (H) 02/16/2021   Lab Results  Component Value Date   TRIG 99 02/16/2021   Lab Results  Component Value Date   CHOLHDL 3.4 02/16/2021   No results found for: HGBA1C    Assessment & Plan:   Problem List Items Addressed This Visit       Endocrine   Other specified hypothyroidism - Primary   Relevant Orders   TSH' Continue current meds     Other   Mixed hyperlipidemia   Relevant Orders   CBC with Differential/Platelet   Comprehensive metabolic panel   Lipid panel Watch diet/continue meds   Vitamin D insufficiency   Relevant Orders   VITAMIN D 25 Hydroxy (Vit-D Deficiency, Fractures) Continue meds                            No orders of the defined types were placed in this encounter.   Follow-up: Return in about 6 months (around 02/27/2022) for chronic fasting follow up.    SARA R Arby Dahir, PA-C

## 2021-08-31 ENCOUNTER — Other Ambulatory Visit: Payer: Self-pay | Admitting: Physician Assistant

## 2021-08-31 DIAGNOSIS — E559 Vitamin D deficiency, unspecified: Secondary | ICD-10-CM

## 2021-08-31 DIAGNOSIS — E782 Mixed hyperlipidemia: Secondary | ICD-10-CM

## 2021-08-31 DIAGNOSIS — E039 Hypothyroidism, unspecified: Secondary | ICD-10-CM

## 2021-08-31 LAB — COMPREHENSIVE METABOLIC PANEL
ALT: 18 IU/L (ref 0–32)
AST: 18 IU/L (ref 0–40)
Albumin/Globulin Ratio: 2 (ref 1.2–2.2)
Albumin: 4.4 g/dL (ref 3.8–4.9)
Alkaline Phosphatase: 57 IU/L (ref 44–121)
BUN/Creatinine Ratio: 30 — ABNORMAL HIGH (ref 9–23)
BUN: 21 mg/dL (ref 6–24)
Bilirubin Total: 0.3 mg/dL (ref 0.0–1.2)
CO2: 26 mmol/L (ref 20–29)
Calcium: 9.3 mg/dL (ref 8.7–10.2)
Chloride: 103 mmol/L (ref 96–106)
Creatinine, Ser: 0.69 mg/dL (ref 0.57–1.00)
Globulin, Total: 2.2 g/dL (ref 1.5–4.5)
Glucose: 91 mg/dL (ref 65–99)
Potassium: 4.3 mmol/L (ref 3.5–5.2)
Sodium: 142 mmol/L (ref 134–144)
Total Protein: 6.6 g/dL (ref 6.0–8.5)
eGFR: 105 mL/min/{1.73_m2} (ref >59)

## 2021-08-31 LAB — CBC WITH DIFFERENTIAL/PLATELET
Basophils Absolute: 0.1 10*3/uL (ref 0.0–0.2)
Basos: 1 %
EOS (ABSOLUTE): 0.3 10*3/uL (ref 0.0–0.4)
Eos: 3 %
Hematocrit: 40.8 % (ref 34.0–46.6)
Hemoglobin: 13.2 g/dL (ref 11.1–15.9)
Immature Grans (Abs): 0 10*3/uL (ref 0.0–0.1)
Immature Granulocytes: 0 %
Lymphocytes Absolute: 3 10*3/uL (ref 0.7–3.1)
Lymphs: 31 %
MCH: 29.5 pg (ref 26.6–33.0)
MCHC: 32.4 g/dL (ref 31.5–35.7)
MCV: 91 fL (ref 79–97)
Monocytes Absolute: 0.5 10*3/uL (ref 0.1–0.9)
Monocytes: 5 %
Neutrophils Absolute: 5.7 10*3/uL (ref 1.4–7.0)
Neutrophils: 60 %
Platelets: 390 10*3/uL (ref 150–450)
RBC: 4.47 x10E6/uL (ref 3.77–5.28)
RDW: 13.2 % (ref 11.7–15.4)
WBC: 9.6 10*3/uL (ref 3.4–10.8)

## 2021-08-31 LAB — LIPID PANEL
Chol/HDL Ratio: 3.2 ratio (ref 0.0–4.4)
Cholesterol, Total: 159 mg/dL (ref 100–199)
HDL: 50 mg/dL (ref >39)
LDL Chol Calc (NIH): 88 mg/dL (ref 0–99)
Triglycerides: 118 mg/dL (ref 0–149)
VLDL Cholesterol Cal: 21 mg/dL (ref 5–40)

## 2021-08-31 LAB — CARDIOVASCULAR RISK ASSESSMENT

## 2021-08-31 LAB — TSH: TSH: 5.19 u[IU]/mL — ABNORMAL HIGH (ref 0.450–4.500)

## 2021-08-31 LAB — VITAMIN D 25 HYDROXY (VIT D DEFICIENCY, FRACTURES): Vit D, 25-Hydroxy: 117 ng/mL — ABNORMAL HIGH (ref 30.0–100.0)

## 2021-08-31 MED ORDER — LEVOTHYROXINE SODIUM 112 MCG PO TABS
112.0000 ug | ORAL_TABLET | Freq: Every day | ORAL | 0 refills | Status: DC
Start: 2021-08-31 — End: 2021-12-20

## 2021-08-31 MED ORDER — ROSUVASTATIN CALCIUM 10 MG PO TABS
10.0000 mg | ORAL_TABLET | Freq: Every day | ORAL | 1 refills | Status: AC
Start: 2021-08-31 — End: ?

## 2021-08-31 MED ORDER — VITAMIN D (ERGOCALCIFEROL) 1.25 MG (50000 UNIT) PO CAPS
50000.0000 [IU] | ORAL_CAPSULE | ORAL | 1 refills | Status: AC
Start: 2021-08-31 — End: ?

## 2021-10-05 ENCOUNTER — Ambulatory Visit
Admission: RE | Admit: 2021-10-05 | Discharge: 2021-10-05 | Disposition: A | Discharge status: Home / Self Care | Payer: BC Managed Care – PPO | Source: Ambulatory Visit | Attending: Physician Assistant | Admitting: Physician Assistant

## 2021-10-05 ENCOUNTER — Other Ambulatory Visit: Payer: Self-pay

## 2021-10-05 DIAGNOSIS — Z1231 Encounter for screening mammogram for malignant neoplasm of breast: Secondary | ICD-10-CM

## 2021-11-01 ENCOUNTER — Other Ambulatory Visit: Payer: BC Managed Care – PPO

## 2021-11-01 ENCOUNTER — Other Ambulatory Visit: Payer: Self-pay

## 2021-11-01 DIAGNOSIS — E039 Hypothyroidism, unspecified: Secondary | ICD-10-CM

## 2021-11-02 LAB — TSH: TSH: 0.582 u[IU]/mL (ref 0.450–4.500)

## 2021-12-20 ENCOUNTER — Other Ambulatory Visit: Payer: Self-pay | Admitting: Physician Assistant

## 2021-12-20 DIAGNOSIS — E039 Hypothyroidism, unspecified: Secondary | ICD-10-CM

## 2022-02-28 ENCOUNTER — Ambulatory Visit: Payer: BC Managed Care – PPO | Admitting: Physician Assistant

## 2022-03-28 ENCOUNTER — Ambulatory Visit: Payer: BC Managed Care – PPO | Admitting: Physician Assistant

## 2022-09-16 ENCOUNTER — Other Ambulatory Visit: Payer: Self-pay | Admitting: Physician Assistant

## 2022-09-16 ENCOUNTER — Other Ambulatory Visit: Payer: Self-pay | Admitting: Nurse Practitioner

## 2022-09-16 DIAGNOSIS — Z1231 Encounter for screening mammogram for malignant neoplasm of breast: Secondary | ICD-10-CM

## 2022-10-10 ENCOUNTER — Ambulatory Visit
Admission: RE | Admit: 2022-10-10 | Discharge: 2022-10-10 | Disposition: A | Discharge status: Home / Self Care | Payer: BC Managed Care – PPO | Source: Ambulatory Visit | Attending: Nurse Practitioner | Admitting: Nurse Practitioner

## 2022-10-10 DIAGNOSIS — Z1231 Encounter for screening mammogram for malignant neoplasm of breast: Secondary | ICD-10-CM

## 2023-09-18 ENCOUNTER — Other Ambulatory Visit: Payer: Self-pay | Admitting: Nurse Practitioner

## 2023-09-18 DIAGNOSIS — Z0000 Encounter for general adult medical examination without abnormal findings: Secondary | ICD-10-CM

## 2023-10-12 ENCOUNTER — Ambulatory Visit
Admission: RE | Admit: 2023-10-12 | Discharge: 2023-10-12 | Disposition: A | Discharge status: Home / Self Care | Payer: BC Managed Care – PPO | Source: Ambulatory Visit | Attending: Nurse Practitioner | Admitting: Nurse Practitioner

## 2023-10-12 DIAGNOSIS — Z0000 Encounter for general adult medical examination without abnormal findings: Secondary | ICD-10-CM

## 2023-10-29 IMAGING — MG MM DIGITAL SCREENING BILAT W/ TOMO AND CAD
8 series · 8 of 24 positions shown · non-contrast
Comparison: Previous exam(s).

ACR Breast Density Category a: The breast tissue is almost entirely
fatty.

CLINICAL DATA: Screening.

EXAM:
DIGITAL SCREENING BILATERAL MAMMOGRAM WITH TOMOSYNTHESIS AND CAD
TECHNIQUE: Bilateral screening digital craniocaudal and mediolateral oblique
mammograms were obtained. Bilateral screening digital breast
tomosynthesis was performed. The images were evaluated with
computer-aided detection.

[R CC synth-2D]
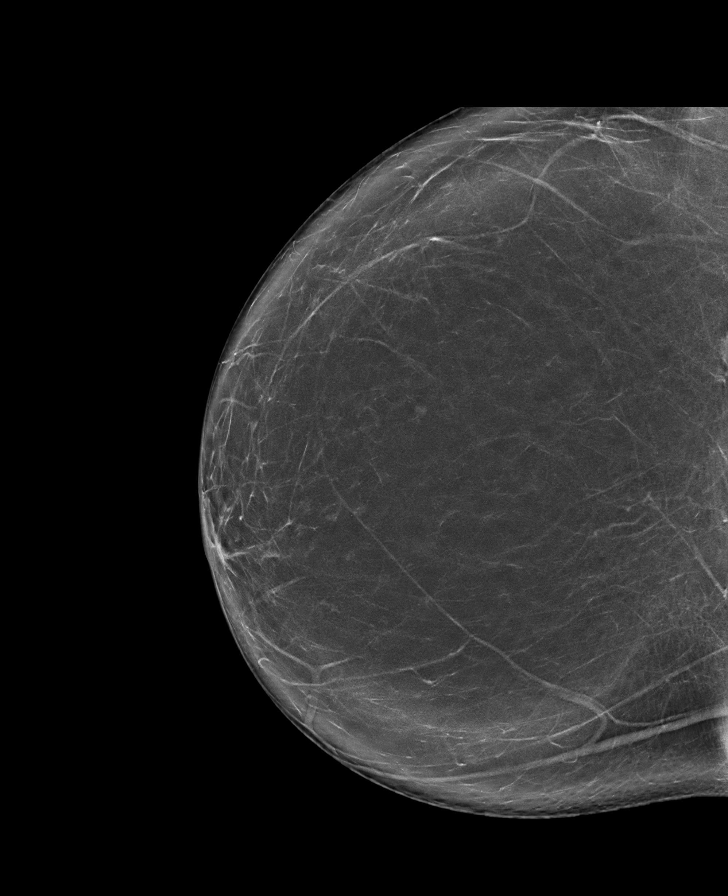

[L CC synth-2D]
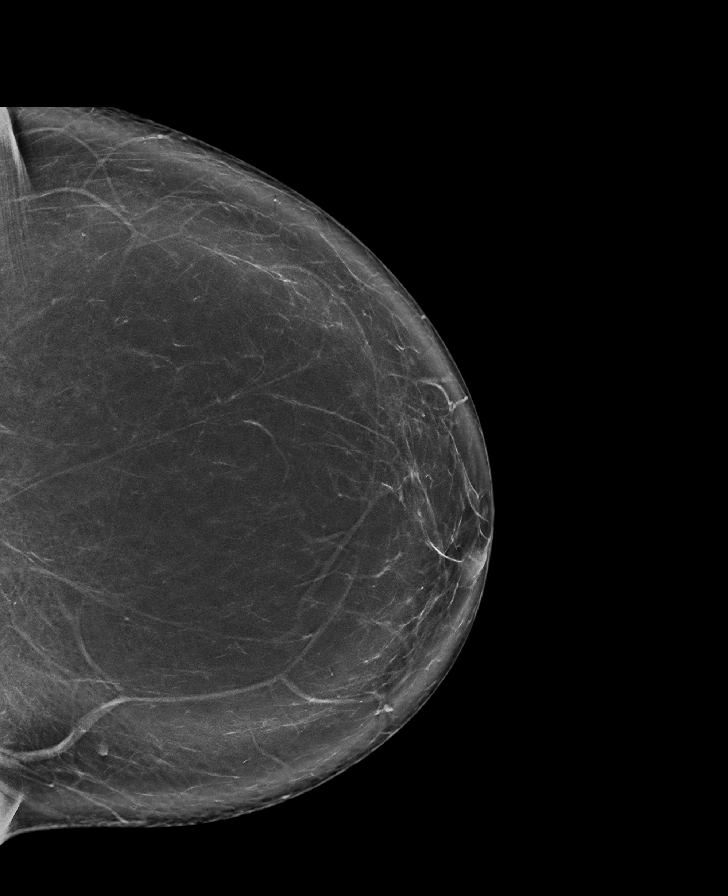

[R MLO synth-2D]
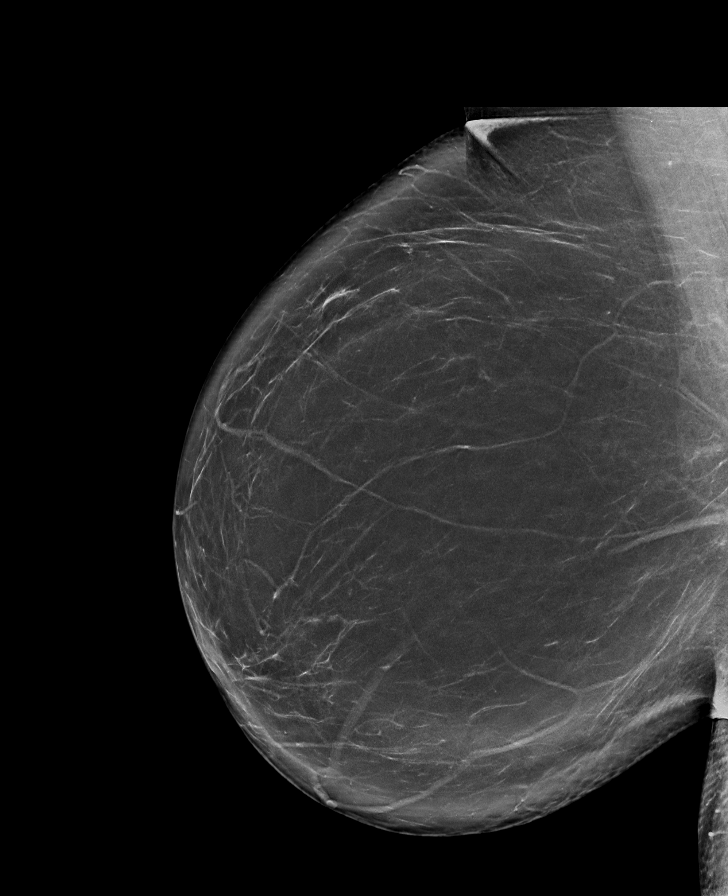

[L MLO synth-2D]
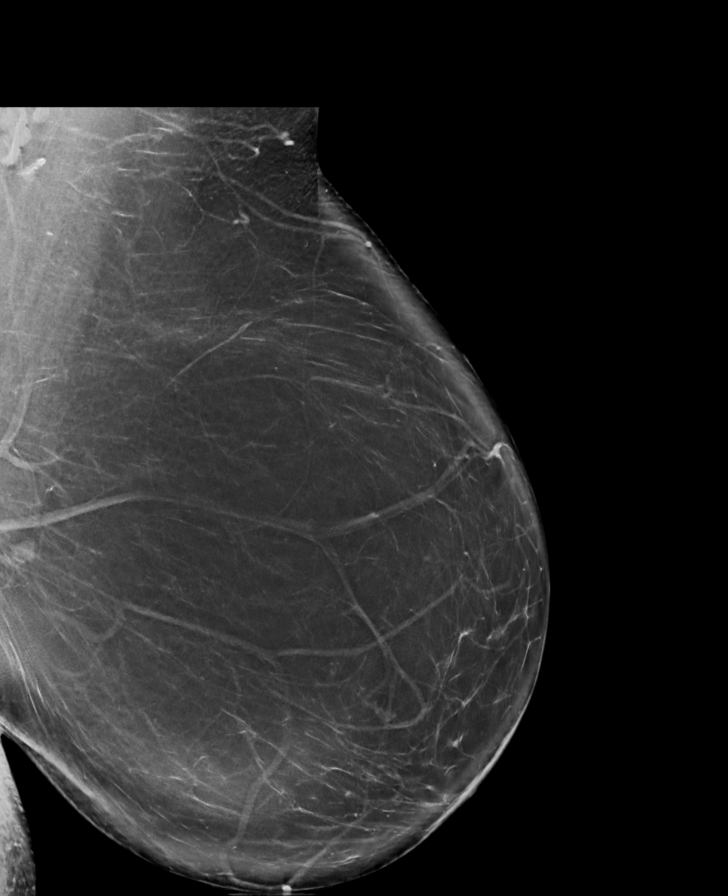

[R CC tomo · tomo slice 46/91.0]
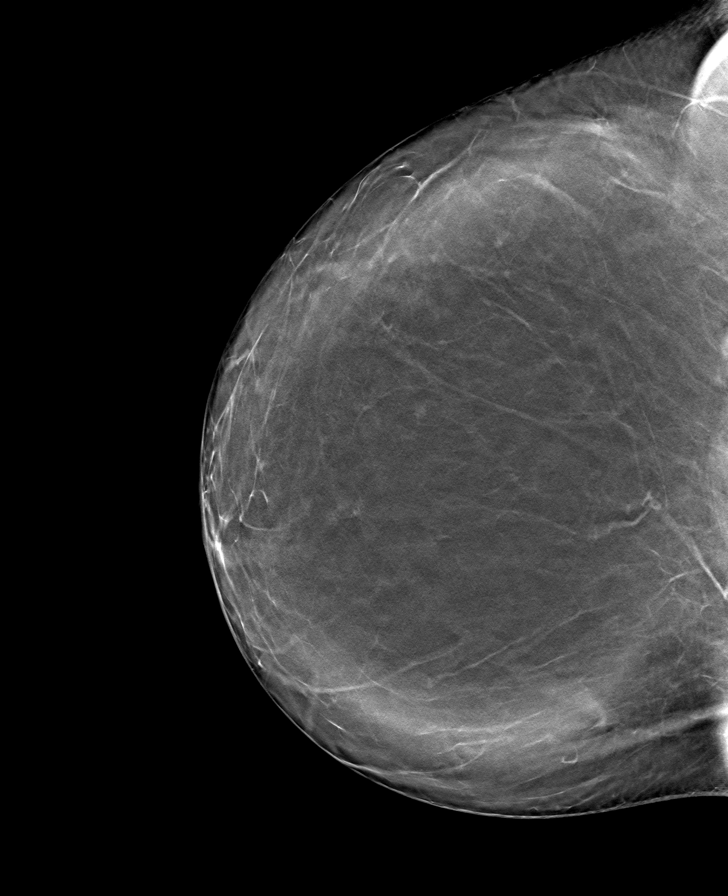

[R MLO tomo · tomo slice 51/100.0]
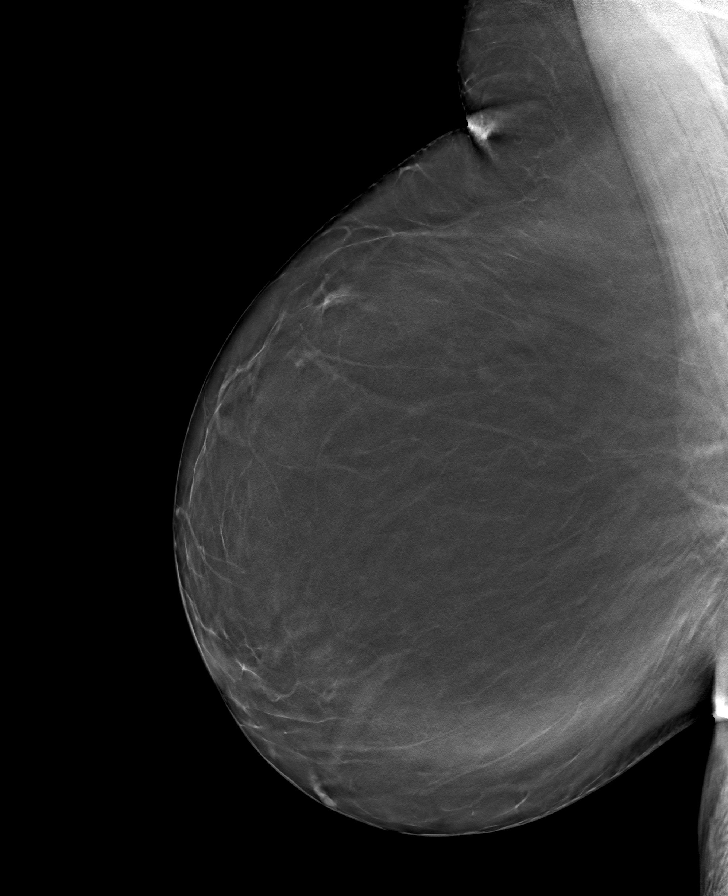

[L CC tomo · tomo slice 47/93.0]
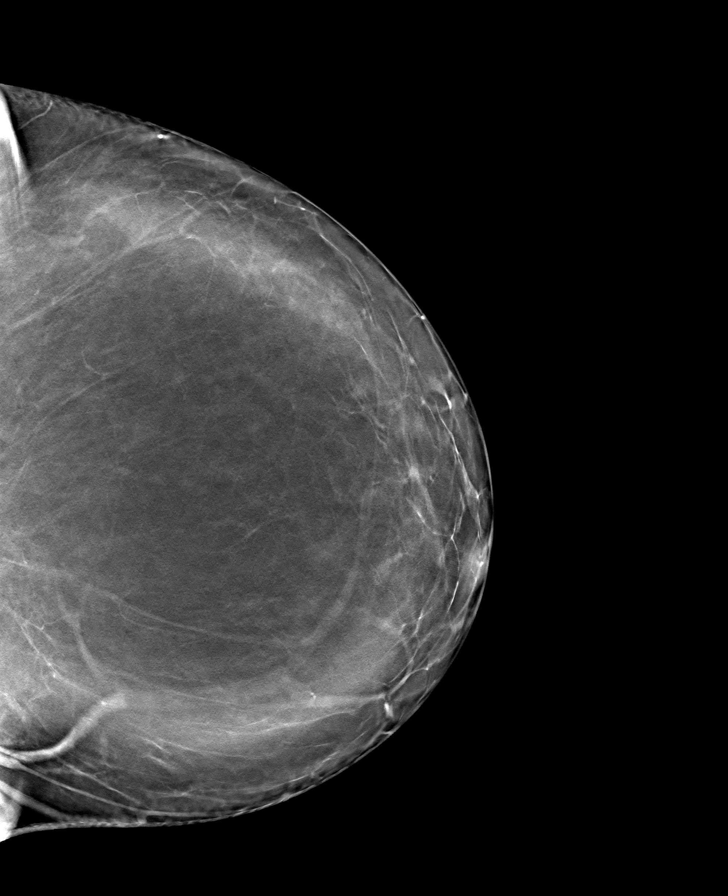

[L MLO tomo · tomo slice 52/103.0]
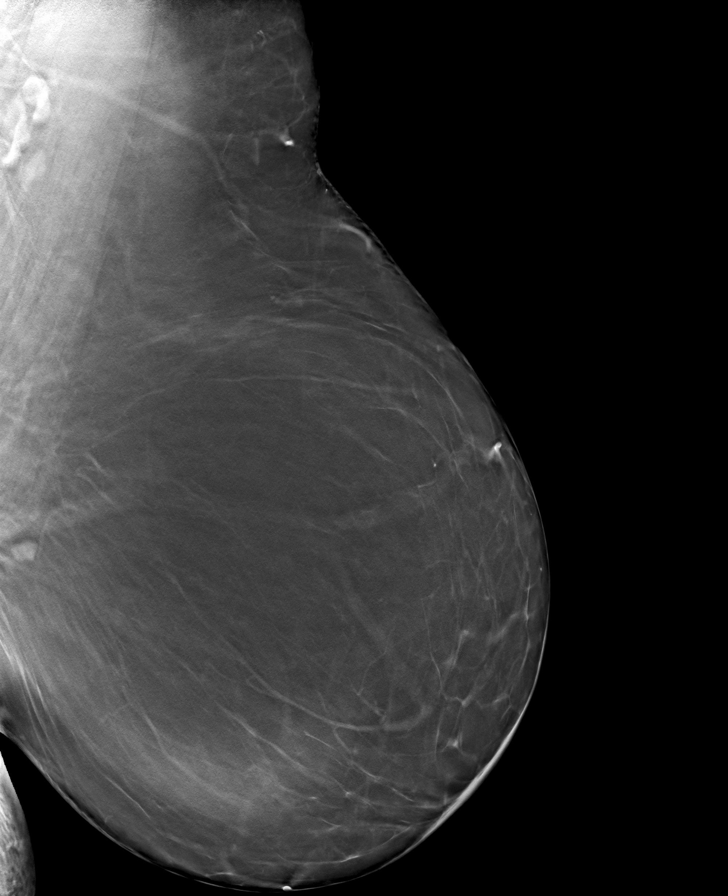

[8 of 24 positions shown; findings below may reference images not displayed]

FINDINGS: There are no findings suspicious for malignancy.
IMPRESSION: No mammographic evidence of malignancy. A result letter of this
screening mammogram will be mailed directly to the patient.

RECOMMENDATION:
Screening mammogram in one year. (Code:0E-3-N98)

BI-RADS CATEGORY  1: Negative.

## 2024-10-15 ENCOUNTER — Other Ambulatory Visit: Payer: Self-pay | Admitting: Nurse Practitioner

## 2024-10-15 DIAGNOSIS — Z1231 Encounter for screening mammogram for malignant neoplasm of breast: Secondary | ICD-10-CM

## 2024-11-14 ENCOUNTER — Ambulatory Visit

## 2024-11-20 ENCOUNTER — Inpatient Hospital Stay
Admission: RE | Admit: 2024-11-20 | Discharge: 2024-11-20 | Attending: Nurse Practitioner | Admitting: Nurse Practitioner

## 2024-11-20 DIAGNOSIS — Z1231 Encounter for screening mammogram for malignant neoplasm of breast: Secondary | ICD-10-CM
